# Patient Record
Sex: Female | Born: 1965 | Race: White | Hispanic: No | Marital: Married | Smoking: Never smoker
Health system: Southern US, Community
[De-identification: ages and names within clinical notes are randomized; demographics above are authoritative.]

## PROBLEM LIST (undated history)

## (undated) DIAGNOSIS — F329 Major depressive disorder, single episode, unspecified: Secondary | ICD-10-CM

## (undated) DIAGNOSIS — F419 Anxiety disorder, unspecified: Secondary | ICD-10-CM

## (undated) DIAGNOSIS — F32A Depression, unspecified: Secondary | ICD-10-CM

## (undated) DIAGNOSIS — F4310 Post-traumatic stress disorder, unspecified: Secondary | ICD-10-CM

## (undated) HISTORY — PX: TONSILLECTOMY: SUR1361

## (undated) HISTORY — PX: FOOT SURGERY: SHX648

## (undated) HISTORY — PX: LAPAROSCOPIC ENDOMETRIOSIS FULGURATION: SUR769

## (undated) HISTORY — PX: DILATION AND CURETTAGE OF UTERUS: SHX78

## (undated) HISTORY — PX: BUNIONECTOMY: SHX129

## (undated) HISTORY — PX: TMJ ARTHROSCOPY: SHX1067

---

## 2006-09-05 ENCOUNTER — Ambulatory Visit (HOSPITAL_COMMUNITY): Admission: RE | Admit: 2006-09-05 | Discharge: 2006-09-05 | Payer: Self-pay | Admitting: Pulmonary Disease

## 2006-09-05 ENCOUNTER — Ambulatory Visit: Payer: Self-pay | Admitting: Pulmonary Disease

## 2006-12-10 ENCOUNTER — Ambulatory Visit: Payer: Self-pay | Admitting: Pulmonary Disease

## 2007-07-14 ENCOUNTER — Emergency Department (HOSPITAL_COMMUNITY): Admission: EM | Admit: 2007-07-14 | Discharge: 2007-07-14 | Payer: Self-pay | Admitting: Emergency Medicine

## 2007-09-17 ENCOUNTER — Ambulatory Visit (HOSPITAL_COMMUNITY): Admission: RE | Admit: 2007-09-17 | Discharge: 2007-09-17 | Payer: Self-pay | Admitting: Urology

## 2009-05-27 ENCOUNTER — Encounter: Admission: RE | Admit: 2009-05-27 | Discharge: 2009-05-27 | Payer: Self-pay | Admitting: Family Medicine

## 2010-09-27 ENCOUNTER — Inpatient Hospital Stay (HOSPITAL_COMMUNITY): Admission: AD | Admit: 2010-09-27 | Payer: Self-pay | Source: Home / Self Care | Admitting: Obstetrics and Gynecology

## 2010-10-11 NOTE — Assessment & Plan Note (Signed)
[facility]                             PULMONARY OFFICE NOTE   ASHANTIA, AMARAL                         MRN:          [id number]  DATE:12/10/2006                            DOB:          [birth year]    I saw Ms. Pelcher in followup today for her recurrent pneumonia, asthma  and allergic rhinitis.  She had spirometry done on November 08, 2006, and  this showed her FEV1 to [facility] ratio was 86%, her FEV1 was 2.68L, which was  84% of predicted, her FVC was 3.111, which 79% of predicted, which would  be suggestive of restricted defect which would be mild, although based  on review of her flow volume loop this was unremarkable.  She did not  receive bronchodilators.  She says that her symptoms are reasonably  stable at the present time.  She is not having much problems with nasal  congestion, post nasal drip, sore throat, cough, wheezing or sputum  production.  She is not having any chest pains either.   CURRENT MEDICATIONS:  1. Allerest twice daily.  2. Astelin as needed.  3. Nasonex as needed.  4. Singulair as needed.   PHYSICAL EXAM:  She is 118 pounds, temperature is 98.1, blood pressure  is 108/61, heart rate is 81, oxygen saturation 98% on room air.  HEENT:  There is no sinus tenderness, no nasal discharge.  She has mild  erythema of the posterior pharynx.  There is no lymphadenopathy.  HEART:  Regular S1, S2.  CHEST:  Clear to auscultation.  ABDOMEN:  Soft, nontender.  EXTREMITIES:  No edema.   IMPRESSION:  Recurrent episodes of pneumonia in the setting of asthma  and allergic rhinitis.  My suspicion is that she probably has flares of  her allergies during the Fall season which may likely trigger a viral  respiratory tract infection which would then predispose her to  pneumonia.  What I have suggested is that she receive early and  aggressive therapy for any signs of upper respiratory infections or  flares of her allergies, in the hopes that this  would prevent her from  getting any recurrence of pneumonia.  I do not feel that she would need  to have any additional testing at this time.  She is to follow up with  her primary care physician, but certainly can be referred back for  further pulmonary evaluation if her symptom status were to worsen.     Coralyn Helling, MD  Electronically Signed    VS/MedQ  DD: 12/10/2006  DT: 12/11/2006  Job #: [id number]   cc:   Tammy R. Collins [place], M.D.

## 2010-10-14 NOTE — Assessment & Plan Note (Signed)
[facility]                             PULMONARY OFFICE NOTE   PHOENIX, RIESEN                         MRN:          [id number]  DATE:09/05/2006                            DOB:          Apr 07, 1966    I met Mrs. Bells today for evaluation of her recurrent pneumonias.   She said that over the last 2 years she has had eight episodes of what  she described as walking pneumonia.  She says that she would get  symptoms of a cold and upper respiratory tract infection as well as  sinus congestion and this progressed to a cough and tired feeling.  She  then developed greenish sputum as well as dyspnea.  She has taken  several courses of antibiotics with improvement in her symptoms.  She  was seen by Dr. Natasha Mead when she lived in [place].  She moved to the  [place] area in November 2007 as a result of her husband having his  job changed.  She says that she had a blood test done which was positive  for mycoplasma when she had her initial episode of walking pneumonia.  She last had a chest x-ray in February 2008 when she had her last  episode of pneumonia.  Unfortunately, I do not have this available for  my review at this time.  She says that she feels like she is starting to  develop sinus congestion again but currently denies any symptoms of  fevers, chills, sweats, coughing, sputum production, chest pain or chest  tightness.  She also denies any symptoms of nausea, vomiting or  diarrhea.  She denies any skin rashes or eczema.  She also denies any  joint tenderness.  She says that she was diagnosed with asthma  approximately 10 years ago.  She had been on Singulair, albuterol and  Advair but has not used these for the last 5 years.  She does get  rhinitis symptoms with the change of symptoms as well as when she is  around cats or if she is exposed to strong smells.  She says that she  also was diagnosed with lupus at [facility] several years  ago.  She says at that time she was having symptoms of hair loss, infertility,  and Raynaud's, but apparently she has not had recurrence of these  symptoms and has not been on any treatment for this.   PAST MEDICAL HISTORY:  Significant for:  1. Asthma.  2. Rhinitis.  3. Endometriosis.  4. Depression.  5. Lupus.  6. She has had bunionectomies on her feet.   CURRENT MEDICATIONS:  AlleRx and Paxil.  She also uses Astelin, Nasonex,  and Singulair on an as-needed basis.  She has no known drug allergies.   SOCIAL HISTORY:  She is married.  She has three adopted children.  There  is no history of tobacco or alcohol use.  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   FAMILY HISTORY:  Significant for her father who had an immune disease  involving his heart and her mother  who had lupus.   REVIEW OF SYSTEMS:  She has a history of rosacea.  She says that she  also had wheat intolerance but never actually had this evaluated for the  possibility of celiac sprue.   PHYSICAL EXAMINATION:  VITAL SIGNS:  She is 5 feet 6 inches tall, 116  pounds.  Temperature is 98, blood pressure is 112/80, heart rate is 80,  oxygen saturation is 100% on room air.  HEENT:  Pupils reactive.  There is no sinus tenderness.  She has dry  nasal mucosa which is pale.  There are no oral lesions, no  lymphadenopathy, no thyromegaly.  HEART:  S1, S2, regular rate and rhythm.  CHEST:  Clear to auscultation.  ABDOMEN:  Thin, soft and tender.  Positive bowel sounds.  EXTREMITIES:  There was no edema, cyanosis, clubbing or rashes.  NEUROLOGIC:  No focal deficits were appreciated.   IMPRESSION:  Recurrent episodes of pneumonia.  I suspect that she has a  history of asthma with allergic rhinitis and that she likely has an  exacerbation of this which then predisposes her to getting recurrent  episodes of pneumonia.  I will continue her on her current regimen of  Astelin and Nasonex for her sinus  symptoms on an as-needed basis.  It  appears that her symptoms are somewhat intermittent otherwise and I do  not think that she would need to be on maintenance therapy for these  conditions at this time.  It is possible that she could be having some  degree of lupus involvement which could be contributing to her recent  episodes of pneumonia, although this seems less likely as she is not  having any other symptoms to go along with this.  I will have her  undergo a repeat chest x-ray as well as pulmonary function tests and  then after review of this, further recommendations will be made.   She does also complain of wheat intolerance.  I suggested that it may be  beneficial to have her undergo evaluation by gastroenterologist for the  possibility of celiac sprue, particularly in light of the fact that she  does have a history of lupus and there may be an association with the  autoimmune diseases.   I will follow up with her in approximately 4 weeks.     Coralyn Helling, MD  Electronically Signed    VS/MedQ  DD: 09/05/2006  DT: 09/05/2006  Job #: [id number]   cc:   Tammy R. Collins [place], M.D.

## 2011-02-17 LAB — DIFFERENTIAL
Basophils Relative: 1
Eosinophils Absolute: 0
Monocytes Absolute: 0.2
Monocytes Relative: 2 — ABNORMAL LOW

## 2011-02-17 LAB — COMPREHENSIVE METABOLIC PANEL
ALT: 13
AST: 19
Albumin: 3.8
Alkaline Phosphatase: 43
Chloride: 101
Potassium: 3.8
Total Bilirubin: 0.8

## 2011-02-17 LAB — WET PREP, GENITAL: Clue Cells Wet Prep HPF POC: NONE SEEN

## 2011-02-17 LAB — RAPID URINE DRUG SCREEN, HOSP PERFORMED
Benzodiazepines: NOT DETECTED
Cocaine: NOT DETECTED
Tetrahydrocannabinol: NOT DETECTED

## 2011-02-17 LAB — CBC
Hemoglobin: 12.1
Platelets: 268
RDW: 12.3

## 2011-02-17 LAB — URINALYSIS, ROUTINE W REFLEX MICROSCOPIC
Glucose, UA: NEGATIVE
Ketones, ur: NEGATIVE
Protein, ur: NEGATIVE
Urobilinogen, UA: 0.2

## 2011-02-17 LAB — GC/CHLAMYDIA PROBE AMP, GENITAL

## 2011-04-05 ENCOUNTER — Other Ambulatory Visit: Payer: Self-pay | Admitting: Family Medicine

## 2011-04-05 DIAGNOSIS — M545 Low back pain, unspecified: Secondary | ICD-10-CM

## 2011-04-07 ENCOUNTER — Ambulatory Visit
Admission: RE | Admit: 2011-04-07 | Discharge: 2011-04-07 | Disposition: A | Discharge status: Home / Self Care | Payer: 59 | Source: Ambulatory Visit | Attending: Family Medicine | Admitting: Family Medicine

## 2011-04-07 DIAGNOSIS — M545 Low back pain, unspecified: Secondary | ICD-10-CM

## 2013-01-10 ENCOUNTER — Emergency Department (HOSPITAL_COMMUNITY)
Admission: EM | Admit: 2013-01-10 | Discharge: 2013-01-10 | Disposition: A | Discharge status: Home / Self Care | Payer: 59 | Attending: Emergency Medicine | Admitting: Emergency Medicine

## 2013-01-10 ENCOUNTER — Encounter (HOSPITAL_COMMUNITY): Payer: Self-pay | Admitting: Emergency Medicine

## 2013-01-10 DIAGNOSIS — Z3202 Encounter for pregnancy test, result negative: Secondary | ICD-10-CM | POA: Insufficient documentation

## 2013-01-10 DIAGNOSIS — F411 Generalized anxiety disorder: Secondary | ICD-10-CM | POA: Insufficient documentation

## 2013-01-10 DIAGNOSIS — Z79899 Other long term (current) drug therapy: Secondary | ICD-10-CM | POA: Insufficient documentation

## 2013-01-10 DIAGNOSIS — F4310 Post-traumatic stress disorder, unspecified: Secondary | ICD-10-CM | POA: Insufficient documentation

## 2013-01-10 DIAGNOSIS — F3289 Other specified depressive episodes: Secondary | ICD-10-CM | POA: Insufficient documentation

## 2013-01-10 DIAGNOSIS — R45851 Suicidal ideations: Secondary | ICD-10-CM | POA: Insufficient documentation

## 2013-01-10 DIAGNOSIS — F329 Major depressive disorder, single episode, unspecified: Secondary | ICD-10-CM | POA: Insufficient documentation

## 2013-01-10 HISTORY — DX: Anxiety disorder, unspecified: F41.9

## 2013-01-10 HISTORY — DX: Major depressive disorder, single episode, unspecified: F32.9

## 2013-01-10 HISTORY — DX: Post-traumatic stress disorder, unspecified: F43.10

## 2013-01-10 HISTORY — DX: Depression, unspecified: F32.A

## 2013-01-10 LAB — COMPREHENSIVE METABOLIC PANEL
ALT: 9 U/L (ref 0–35)
AST: 17 U/L (ref 0–37)
Albumin: 3.9 g/dL (ref 3.5–5.2)
Alkaline Phosphatase: 70 U/L (ref 39–117)
BUN: 10 mg/dL (ref 6–23)
Chloride: 105 mEq/L (ref 96–112)
Potassium: 3.5 mEq/L (ref 3.5–5.1)
Sodium: 140 mEq/L (ref 135–145)
Total Bilirubin: 0.7 mg/dL (ref 0.3–1.2)

## 2013-01-10 LAB — POCT PREGNANCY, URINE: Preg Test, Ur: NEGATIVE

## 2013-01-10 LAB — CBC WITH DIFFERENTIAL/PLATELET
Basophils Relative: 1 % (ref 0–1)
Hemoglobin: 12.2 g/dL (ref 12.0–15.0)
MCHC: 31.7 g/dL (ref 30.0–36.0)
Monocytes Relative: 9 % (ref 3–12)
Neutro Abs: 3.6 10*3/uL (ref 1.7–7.7)
Neutrophils Relative %: 61 % (ref 43–77)
RBC: 4.62 MIL/uL (ref 3.87–5.11)

## 2013-01-10 LAB — RAPID URINE DRUG SCREEN, HOSP PERFORMED
Barbiturates: NOT DETECTED
Opiates: NOT DETECTED
Tetrahydrocannabinol: NOT DETECTED

## 2013-01-10 LAB — ETHANOL: Alcohol, Ethyl (B): <11 mg/dL (ref 0–11)

## 2013-01-10 MED ORDER — IBUPROFEN 200 MG PO TABS: 600.0000 mg | ORAL_TABLET | Freq: Three times a day (TID) | ORAL | Status: DC | PRN

## 2013-01-10 MED ORDER — ALPRAZOLAM 0.25 MG PO TABS: 0.2500 mg | ORAL_TABLET | Freq: Every day | ORAL | Status: DC | PRN

## 2013-01-10 MED ORDER — ONDANSETRON HCL 4 MG PO TABS: 4.0000 mg | ORAL_TABLET | Freq: Three times a day (TID) | ORAL | Status: DC | PRN

## 2013-01-10 MED ORDER — NICOTINE 21 MG/24HR TD PT24: 21.0000 mg | MEDICATED_PATCH | Freq: Every day | TRANSDERMAL | Status: DC | PRN

## 2013-01-10 MED ORDER — MIRTAZAPINE 30 MG PO TABS: 30.0000 mg | ORAL_TABLET | Freq: Every day | ORAL | Status: DC

## 2013-01-10 MED ORDER — PROBIOTIC DAILY PO CAPS: 1.0000 | ORAL_CAPSULE | Freq: Every day | ORAL | Status: DC

## 2013-01-10 MED ORDER — PANTOPRAZOLE SODIUM 40 MG PO TBEC
40.0000 mg | DELAYED_RELEASE_TABLET | Freq: Every day | ORAL | Status: DC
  Filled 2013-01-10: qty 1

## 2013-01-10 MED ORDER — DULOXETINE HCL 60 MG PO CPEP: 60.0000 mg | ORAL_CAPSULE | Freq: Every day | ORAL | Status: DC

## 2013-01-10 MED ORDER — ZOLPIDEM TARTRATE 5 MG PO TABS: 5.0000 mg | ORAL_TABLET | Freq: Every evening | ORAL | Status: DC | PRN

## 2013-01-10 MED ORDER — RISAQUAD PO CAPS
1.0000 | ORAL_CAPSULE | Freq: Every day | ORAL | Status: DC
  Filled 2013-01-10: qty 1

## 2013-01-10 MED ORDER — ACETAMINOPHEN 325 MG PO TABS: 650.0000 mg | ORAL_TABLET | ORAL | Status: DC | PRN

## 2013-01-10 MED ORDER — LORAZEPAM 1 MG PO TABS: 1.0000 mg | ORAL_TABLET | Freq: Three times a day (TID) | ORAL | Status: DC | PRN

## 2013-01-10 NOTE — ED Provider Notes (Signed)
CSN: [id number]     Arrival date & time 01/10/13  1139 History     First MD Initiated Contact with Patient 01/10/13 1146     Chief Complaint  Patient presents with  . Medical Clearance  . Suicidal   (Consider location/radiation/quality/duration/timing/severity/associated sxs/prior Treatment) The history is provided by the patient and medical records.   Patient presents to the ED for SI with plan. Patient states she was seen at her counselor's office this morning and was referred to the ED for further evaluation. Patient states on Wednesday 01/08/13, she planned suicide by drug and EtOH overdose but her counselor arrived at her home before she could follow through with her plan. Patient states she continues to have suicidal thoughts, but they are not as intense as they were earlier in the week.  Patient does not have a specific plan at this time. Denies any illicit drug or alcohol abuse. Patient has a history of PTSD, anxiety, and depression. States she has been taking all her medications as directed. Denies HI, or AVH. Denies any chest pain, shortness of breath, abdominal pain, nausea, vomiting, or diarrhea. No recent fevers, sweats, or chills.  Past Medical History  Diagnosis Date  . PTSD (post-traumatic stress disorder)   . Anxiety   . Depression    Past Surgical History  Procedure Laterality Date  . Bunionectomy    . Laparoscopic endometriosis fulguration    . Tonsillectomy     No family history on file. History  Substance Use Topics  . Smoking status: Never Smoker   . Smokeless tobacco: Not on file  . Alcohol Use: No   OB History   Grav Para Term Preterm Abortions TAB SAB Ect Mult Living                 Review of Systems  Psychiatric/Behavioral: Positive for suicidal ideas.  All other systems reviewed and are negative.    Allergies  Wheat bran  Home Medications   Current Outpatient Rx  Name  Route  Sig  Dispense  Refill  . ALPRAZolam (XANAX) 0.25 MG tablet  Oral   Take 0.25 mg by mouth daily as needed for anxiety.         Marland Kitchen dexlansoprazole (DEXILANT) 60 MG capsule   Oral   Take 60 mg by mouth daily.         . DULoxetine (CYMBALTA) 60 MG capsule   Oral   Take 60 mg by mouth daily.         . mirtazapine (REMERON) 30 MG tablet   Oral   Take 30 mg by mouth at bedtime.         . Probiotic Product (PROBIOTIC DAILY PO)   Oral   Take 1 capsule by mouth daily.          BP 129/66  Pulse 88  Temp(Src) 98.4 F (36.9 C) (Oral)  Resp 20  SpO2 100%  LMP 12/27/2012  Physical Exam  Nursing note and vitals reviewed. Constitutional: She is oriented to person, place, and time. She appears well-developed and well-nourished. No distress.  HENT:  Head: Normocephalic and atraumatic.  Mouth/Throat: Oropharynx is clear and moist.  Eyes: Conjunctivae and EOM are normal. Pupils are equal, round, and reactive to light.  Neck: Normal range of motion. Neck supple.  Cardiovascular: Normal rate, regular rhythm and normal heart sounds.   Pulmonary/Chest: Effort normal and breath sounds normal. No respiratory distress. She has no wheezes.  Abdominal: Soft. Bowel sounds are normal. There  is no tenderness. There is no guarding.  Musculoskeletal: Normal range of motion.  Neurological: She is alert and oriented to person, place, and time.  Skin: Skin is warm and dry. She is not diaphoretic.  Psychiatric: Her speech is normal. She is not actively hallucinating. She exhibits a depressed mood. She expresses suicidal ideation. She expresses no homicidal ideation. She expresses no suicidal plans and no homicidal plans.    ED Course   Procedures (including critical care time)  Labs Reviewed  SALICYLATE LEVEL - Abnormal; Notable for the following:    Salicylate Lvl <2.0 (*)    All other components within normal limits  CBC WITH DIFFERENTIAL  COMPREHENSIVE METABOLIC PANEL  URINE RAPID DRUG SCREEN (HOSP PERFORMED)  ETHANOL  ACETAMINOPHEN LEVEL  POCT  PREGNANCY, URINE   No results found. No diagnosis found.  MDM   Pt sent from counselors office for psych eval for SI.  Labs as above.  Pt medically cleared and moved to psych ED.  Temp holding orders and home meds placed.  [facility] consulted, they will see her.  VS stable.  Garlon Hatchet, PA-C 01/10/13 [phone]

## 2013-01-10 NOTE — Progress Notes (Signed)
Patient ID: Patty Garrison, female   DOB: 20-Nov-1965, 47 y.o.   MRN: [id number] Pt consulted on -no SI/HI contracts for safety-note to follow-may go home with husband-has intensive IOP with her personal counselor M,W,F next week

## 2013-01-10 NOTE — ED Provider Notes (Signed)
Psych team had evaluated patient and indicated that pt psych cleared for discharge to home, no SI.  I went to reassess/recheck pt, and pt has already left ED, allowed to leave ED by psych ED staff based upon psych teams/psychiatrist evaluation and recommendations to them and pt.   Suzi Roots, MD 01/10/13 2221

## 2013-01-10 NOTE — ED Notes (Signed)
Pt is awake and alert, pt report that her therapist requested for her to come get treatment.  denies HI,SI, pt is requesting to be discharged. Pt is pleasant and cooperative. Support and encouragement offered. Will continue to monitor for safety.

## 2013-01-10 NOTE — ED Notes (Signed)
Pt has 2 belonging bags placed into locker 36

## 2013-01-10 NOTE — Progress Notes (Signed)
pcp is Dr Janace Litten Epic updated

## 2013-01-10 NOTE — ED Notes (Signed)
Pt from home, states she just left her counselor that referred her to this facility d/t SI. Pt states that she attempted to take over 100 pills with ETOH Wednesday night, but her counselor arrived before she could do it. Pt states that she feels like she could harm herself today, but cannot give a plan. Pt is A&O and in NAD.

## 2013-01-12 DIAGNOSIS — R45851 Suicidal ideations: Secondary | ICD-10-CM

## 2013-01-12 DIAGNOSIS — F411 Generalized anxiety disorder: Secondary | ICD-10-CM

## 2013-01-12 DIAGNOSIS — F329 Major depressive disorder, single episode, unspecified: Secondary | ICD-10-CM

## 2013-01-12 DIAGNOSIS — F3289 Other specified depressive episodes: Secondary | ICD-10-CM

## 2013-01-12 DIAGNOSIS — F4310 Post-traumatic stress disorder, unspecified: Secondary | ICD-10-CM

## 2013-01-12 NOTE — ED Provider Notes (Signed)
Medical screening examination/treatment/procedure(s) were performed by non-physician practitioner and as supervising physician I was immediately available for consultation/collaboration.    Vida Roller, MD 01/12/13 [phone]

## 2013-01-12 NOTE — Consult Note (Signed)
[facility] Psychiatry Consult   Reason for Consult: ED Referral Referring Physician:  ED providers [name] is an 47 y.o. female.  Assessment: AXIS I:  Depression with suicidal ideation/PTSD/Anxiety AXIS II:  Deferred AXIS III:   Past Medical History  Diagnosis Date  . PTSD (post-traumatic stress disorder)   . Anxiety   . Depression    AXIS IV:  other psychosocial or environmental problems AXIS V:  41-50 serious symptoms  Plan:  No evidence of imminent risk to self or others at present.    Subjective:   Patty Garrison is a 47 y.o. female patient self admitted with perssitent suicidal ideation after thwarted attempt Weds.Marland Kitchen  HPI:  As above-also see ED admission note Pt related she began to deteriorate after she terminated her relationship with a therapit who was using her to provide support to fellow PTSD patients(? Inappropriately). With the loss of her support sytem and the stress of 3 "rambuncious boys" at home she has become increasingly stressed/ depressed and suicidal.She has however found a new therapist who apparently intervened WEDS at pt home and advised her to come here today as noted in ED admission note. She has taken her meds as prescribed during this period she says. She c/o of being here for 10 hours without evaluation ? She reports that the time has allowed her to contemplate  and reflect on her situation.She has lost the feeling she wants to commit suicide.She reports she has worked out a Higher education careers adviser where her husband will stay at home on Monday/the boys will be returning toschool and she has IOP MWF with her new therapist.  HPI Elements:   Context:  as above.  Past Psychiatric History: Past Medical History  Diagnosis Date  . PTSD (post-traumatic stress disorder)   . Anxiety   . Depression     reports that she has never smoked. She does not have any smokeless tobacco history on file. She reports that she does not drink alcohol or use illicit drugs. No family history on file.          Allergies:   Allergies  Allergen Reactions  . Wheat Bran Other (See Comments)    Headache Stomach cramps    Past Psychiatric History: Diagnosis:  Depression with suicidal ideation in the background of PTSD (survivor)  Hospitalizations:  None reported  Outpatient Care:  As reported  Substance Abuse Care:  NA  Self-Mutilation:  NA  Suicidal Attempts:  Weds nite as noted  Violent Behaviors:  NA   Objective: Blood pressure 109/67, pulse 79, temperature 98.9 F (37.2 C), temperature source Oral, resp. rate 20, last menstrual period 12/27/2012, SpO2 98.00%.There is no height or weight on file to calculate BMI. Results for orders placed during the hospital encounter of 01/10/13 (from the past 72 hour(s))  CBC WITH DIFFERENTIAL     Status: None   Collection Time    01/10/13 12:16 PM      Result Value Range   WBC 5.9  4.0 - 10.5 K/uL   RBC 4.62  3.87 - 5.11 MIL/uL   Hemoglobin 12.2  12.0 - 15.0 g/dL   HCT 16.1  09.6 - 04.5 %   MCV 83.3  78.0 - 100.0 fL   MCH 26.4  26.0 - 34.0 pg   MCHC 31.7  30.0 - 36.0 g/dL   RDW 40.9  81.1 - 91.4 %   Platelets 312  150 - 400 K/uL   Neutrophils Relative % 61  43 - 77 %  Neutro Abs 3.6  1.7 - 7.7 K/uL   Lymphocytes Relative 28  12 - 46 %   Lymphs Abs 1.7  0.7 - 4.0 K/uL   Monocytes Relative 9  3 - 12 %   Monocytes Absolute 0.5  0.1 - 1.0 K/uL   Eosinophils Relative 1  0 - 5 %   Eosinophils Absolute 0.1  0.0 - 0.7 K/uL   Basophils Relative 1  0 - 1 %   Basophils Absolute 0.1  0.0 - 0.1 K/uL  COMPREHENSIVE METABOLIC PANEL     Status: None   Collection Time    01/10/13 12:16 PM      Result Value Range   Sodium 140  135 - 145 mEq/L   Potassium 3.5  3.5 - 5.1 mEq/L   Chloride 105  96 - 112 mEq/L   CO2 26  19 - 32 mEq/L   Glucose, Bld 76  70 - 99 mg/dL   BUN 10  6 - 23 mg/dL   Creatinine, Ser 4.09  0.50 - 1.10 mg/dL   Calcium 9.1  8.4 - 81.1 mg/dL   Total Protein 7.3  6.0 - 8.3 g/dL   Albumin 3.9  3.5 - 5.2 g/dL   AST 17  0 - 37  U/L   ALT 9  0 - 35 U/L   Alkaline Phosphatase 70  39 - 117 U/L   Total Bilirubin 0.7  0.3 - 1.2 mg/dL   GFR calc non Af Amer >90  >90 mL/min   GFR calc Af Amer >90  >90 mL/min   Comment: (NOTE)     The eGFR has been calculated using the CKD EPI equation.     This calculation has not been validated in all clinical situations.     eGFR's persistently <90 mL/min signify possible Chronic Kidney     Disease.  ETHANOL     Status: None   Collection Time    01/10/13 12:16 PM      Result Value Range   Alcohol, Ethyl (B) <11  0 - 11 mg/dL   Comment:            LOWEST DETECTABLE LIMIT FOR     SERUM ALCOHOL IS 11 mg/dL     FOR MEDICAL PURPOSES ONLY  ACETAMINOPHEN LEVEL     Status: None   Collection Time    01/10/13 12:16 PM      Result Value Range   Acetaminophen (Tylenol), Serum <15.0  10 - 30 ug/mL   Comment:            THERAPEUTIC CONCENTRATIONS VARY     SIGNIFICANTLY. A RANGE OF 10-30     ug/mL MAY BE AN EFFECTIVE     CONCENTRATION FOR MANY PATIENTS.     HOWEVER, SOME ARE BEST TREATED     AT CONCENTRATIONS OUTSIDE THIS     RANGE.     ACETAMINOPHEN CONCENTRATIONS     >150 ug/mL AT 4 HOURS AFTER     INGESTION AND >50 ug/mL AT 12     HOURS AFTER INGESTION ARE     OFTEN ASSOCIATED WITH TOXIC     REACTIONS.  SALICYLATE LEVEL     Status: Abnormal   Collection Time    01/10/13 12:16 PM      Result Value Range   Salicylate Lvl <2.0 (*) 2.8 - 20.0 mg/dL  URINE RAPID DRUG SCREEN (HOSP PERFORMED)     Status: None   Collection Time    01/10/13 12:29 PM  Result Value Range   Opiates NONE DETECTED  NONE DETECTED   Cocaine NONE DETECTED  NONE DETECTED   Benzodiazepines NONE DETECTED  NONE DETECTED   Amphetamines NONE DETECTED  NONE DETECTED   Tetrahydrocannabinol NONE DETECTED  NONE DETECTED   Barbiturates NONE DETECTED  NONE DETECTED   Comment:            DRUG SCREEN FOR MEDICAL PURPOSES     ONLY.  IF CONFIRMATION IS NEEDED     FOR ANY PURPOSE, NOTIFY LAB     WITHIN 5 DAYS.                 LOWEST DETECTABLE LIMITS     FOR URINE DRUG SCREEN     Drug Class       Cutoff (ng/mL)     Amphetamine      1000     Barbiturate      200     Benzodiazepine   200     Tricyclics       300     Opiates          300     Cocaine          300     THC              50  POCT PREGNANCY, URINE     Status: None   Collection Time    01/10/13 12:34 PM      Result Value Range   Preg Test, Ur NEGATIVE  NEGATIVE   Comment:            THE SENSITIVITY OF THIS     METHODOLOGY IS >24 mIU/mL   Labs are reviewed and are pertinent for normal values.  No current facility-administered medications for this encounter.   Current Outpatient Prescriptions  Medication Sig Dispense Refill  . ALPRAZolam (XANAX) 0.25 MG tablet Take 0.25 mg by mouth daily as needed for anxiety.      Marland Kitchen dexlansoprazole (DEXILANT) 60 MG capsule Take 60 mg by mouth daily.      . DULoxetine (CYMBALTA) 60 MG capsule Take 60 mg by mouth daily.      . mirtazapine (REMERON) 30 MG tablet Take 30 mg by mouth at bedtime.      . Probiotic Product (PROBIOTIC DAILY PO) Take 1 capsule by mouth daily.        Psychiatric Specialty Exam:     Blood pressure 109/67, pulse 79, temperature 98.9 F (37.2 C), temperature source Oral, resp. rate 20, last menstrual period 12/27/2012, SpO2 98.00%.There is no height or weight on file to calculate BMI.  General Appearance: Well Groomed  Patent attorney::  Good  Speech:  Clear and Coherent  Volume:  Normal  Mood:  Euthymic  Affect:  Congruent  Thought Process:  Goal Directed  Orientation:  Full (Time, Place, and Person)  Thought Content:  WDL  Suicidal Thoughts:  No  Homicidal Thoughts:  No  Memory:  Negative  Judgement:  Fair  Insight:  Fair  Psychomotor Activity:  Normal  Concentration:  Good  Recall:  Good  Akathisia:  NA  Handed:  Right  AIMS (if indicated):     Assets:  Social Support  Sleep:   Disturbed   Treatment Plan Summary: Per HPI/patient plan as reported.Husband  is here to pick her up  Court Joy 01/12/2013 11:07 PM

## 2013-01-13 NOTE — Consult Note (Signed)
Agree with plan for husband to closely monitor the pt

## 2016-06-12 DIAGNOSIS — K219 Gastro-esophageal reflux disease without esophagitis: Secondary | ICD-10-CM | POA: Diagnosis not present

## 2016-06-12 DIAGNOSIS — D509 Iron deficiency anemia, unspecified: Secondary | ICD-10-CM | POA: Diagnosis not present

## 2016-11-11 DIAGNOSIS — M79675 Pain in left toe(s): Secondary | ICD-10-CM | POA: Diagnosis not present

## 2017-02-27 DIAGNOSIS — E010 Iodine-deficiency related diffuse (endemic) goiter: Secondary | ICD-10-CM | POA: Diagnosis not present

## 2017-02-27 DIAGNOSIS — R509 Fever, unspecified: Secondary | ICD-10-CM | POA: Diagnosis not present

## 2017-02-27 DIAGNOSIS — E0789 Other specified disorders of thyroid: Secondary | ICD-10-CM | POA: Diagnosis not present

## 2017-02-28 ENCOUNTER — Other Ambulatory Visit: Payer: Self-pay | Admitting: Family Medicine

## 2017-02-28 DIAGNOSIS — E010 Iodine-deficiency related diffuse (endemic) goiter: Secondary | ICD-10-CM

## 2017-03-01 DIAGNOSIS — E060 Acute thyroiditis: Secondary | ICD-10-CM | POA: Diagnosis not present

## 2017-03-02 ENCOUNTER — Ambulatory Visit
Admission: RE | Admit: 2017-03-02 | Discharge: 2017-03-02 | Disposition: A | Discharge status: Home / Self Care | Payer: 59 | Source: Ambulatory Visit | Attending: Family Medicine | Admitting: Family Medicine

## 2017-03-02 DIAGNOSIS — E010 Iodine-deficiency related diffuse (endemic) goiter: Secondary | ICD-10-CM

## 2017-03-06 ENCOUNTER — Other Ambulatory Visit: Payer: Self-pay | Admitting: Family Medicine

## 2017-03-06 DIAGNOSIS — E041 Nontoxic single thyroid nodule: Secondary | ICD-10-CM

## 2017-03-08 ENCOUNTER — Other Ambulatory Visit: Payer: Self-pay | Admitting: Family Medicine

## 2017-03-08 DIAGNOSIS — E041 Nontoxic single thyroid nodule: Secondary | ICD-10-CM

## 2017-03-12 ENCOUNTER — Emergency Department (HOSPITAL_COMMUNITY)
Admission: EM | Admit: 2017-03-12 | Discharge: 2017-03-13 | Disposition: A | Discharge status: Home / Self Care | Payer: 59 | Attending: Emergency Medicine | Admitting: Emergency Medicine

## 2017-03-12 ENCOUNTER — Encounter (HOSPITAL_COMMUNITY): Payer: Self-pay | Admitting: *Deleted

## 2017-03-12 DIAGNOSIS — E060 Acute thyroiditis: Secondary | ICD-10-CM | POA: Diagnosis not present

## 2017-03-12 DIAGNOSIS — D72829 Elevated white blood cell count, unspecified: Secondary | ICD-10-CM | POA: Insufficient documentation

## 2017-03-12 DIAGNOSIS — R509 Fever, unspecified: Secondary | ICD-10-CM | POA: Diagnosis not present

## 2017-03-12 DIAGNOSIS — Z79899 Other long term (current) drug therapy: Secondary | ICD-10-CM | POA: Diagnosis not present

## 2017-03-12 MED ORDER — ACETAMINOPHEN 325 MG PO TABS
650.0000 mg | ORAL_TABLET | Freq: Once | ORAL | Status: AC
  Administered 2017-03-12: 650 mg via ORAL

## 2017-03-12 MED ORDER — DEXTROSE 5 % IV SOLN
1.0000 g | Freq: Once | INTRAVENOUS | Status: AC
  Administered 2017-03-13: 1 g via INTRAVENOUS
  Filled 2017-03-12: qty 10

## 2017-03-12 MED ORDER — ACETAMINOPHEN 325 MG PO TABS
ORAL_TABLET | ORAL | Status: AC
  Filled 2017-03-12: qty 2

## 2017-03-12 NOTE — ED Triage Notes (Signed)
Pt in from PCP office for elevated WBC, sent here for IV antibiotics for thyroiditis, pt denies pain, denies n/v/d, reports ongoing fever over the last month, A&O x4

## 2017-03-12 NOTE — ED Provider Notes (Signed)
[facility] EMERGENCY DEPARTMENT Provider Note   CSN: [id number] Arrival date & time: 03/12/17  1621     History   Chief Complaint Chief Complaint  Patient presents with  . Abnormal Lab    HPI Patty Garrison is a 51 y.o. female.  HPI  This is a 51 year old female with a history of anxiety and depression who presents with fevers. Patient reports that she has been evaluated multiple times in the last month by her primary physician. She reports daily fevers as recently as 102.  Patient reports that she was diagnosed with thyroiditis. She was given steroids which helped for some time but then had recurrence of fevers. She saw her primary physician today who did repeat lab work. During this timeframe she has only had sore throat. She denies any cough, chest pain, shortness of breath, nausea, vomiting, diarrhea.  She reports that she has had a thyroid ultrasound and is due to have her thyroid biopsy on Thursday. She reports that she was given amoxicillin by her primary physician today to treat for possible infection. Once that her white blood count was elevated and her primary physician called her to come to the ER for "IV antibiotics because they will work faster."  Past Medical History:  Diagnosis Date  . Anxiety   . Depression   . PTSD (post-traumatic stress disorder)     There are no active problems to display for this patient.   Past Surgical History:  Procedure Laterality Date  . BUNIONECTOMY    . LAPAROSCOPIC ENDOMETRIOSIS FULGURATION    . TONSILLECTOMY      OB History    No data available       Home Medications    Prior to Admission medications   Medication Sig Start Date End Date Taking? Authorizing Provider  amoxicillin-clavulanate (AUGMENTIN) 875-125 MG tablet Take 1 tablet by mouth 2 (two) times daily.   Yes [provider]  cetirizine (ZYRTEC) 10 MG tablet Take 10 mg by mouth daily.   Yes [provider]  fluticasone  (FLONASE) 50 MCG/ACT nasal spray Place 1 spray into both nostrils at bedtime.   Yes [provider]  mirtazapine (REMERON) 15 MG tablet Take 15 mg by mouth at bedtime.   Yes [provider]  predniSONE (DELTASONE) 20 MG tablet Take 40 mg by mouth daily with breakfast.   Yes [provider]  Probiotic Product (PROBIOTIC DAILY PO) Take 1 capsule by mouth daily.   Yes [provider]  propranolol (INDERAL) 10 MG tablet Take 10 mg by mouth daily.   Yes [provider]    Family History No family history on file.  Social History Social History  Substance Use Topics  . Smoking status: Never Smoker  . Smokeless tobacco: Never Used  . Alcohol use No     Allergies   Wheat bran   Review of Systems Review of Systems  Constitutional: Positive for fever. Negative for fatigue.  HENT: Positive for sore throat. Negative for trouble swallowing.   Respiratory: Negative for cough and shortness of breath.   Cardiovascular: Negative for chest pain.  Gastrointestinal: Negative for abdominal pain, nausea and vomiting.  Genitourinary: Negative for dysuria.  Musculoskeletal: Negative for neck pain and neck stiffness.  All other systems reviewed and are negative.    Physical Exam Updated Vital Signs BP 113/70   Pulse (!) 58   Temp 97.9 F (36.6 C) (Oral)   Resp 18   LMP 03/12/2017 (Approximate)  SpO2 100%   Physical Exam  Constitutional: She is oriented to person, place, and time. She appears well-developed and well-nourished. No distress.  HENT:  Head: Normocephalic and atraumatic.  Mouth/Throat: Oropharynx is clear and moist. No oropharyngeal exudate.  Eyes: Pupils are equal, round, and reactive to light. EOM are normal.  Neck: Normal range of motion. Neck supple. No tracheal deviation present. Thyromegaly present.  Cardiovascular: Normal rate, regular rhythm and normal heart sounds.   Pulmonary/Chest: Effort normal and breath sounds normal.  No respiratory distress. She has no wheezes.  Abdominal: Soft. Bowel sounds are normal. There is no tenderness.  Lymphadenopathy:    She has no cervical adenopathy.  Neurological: She is alert and oriented to person, place, and time.  Skin: Skin is warm and dry.  Psychiatric: She has a normal mood and affect.  Nursing note and vitals reviewed.    ED Treatments / Results  Labs (all labs ordered are listed, but only abnormal results are displayed) Labs Reviewed  CULTURE, BLOOD (ROUTINE X 2)  CULTURE, BLOOD (ROUTINE X 2)    EKG  EKG Interpretation None       Radiology No results found.  Procedures Procedures (including critical care time)  Medications Ordered in ED Medications  acetaminophen (TYLENOL) tablet 650 mg (650 mg Oral Given 03/12/17 2153)  cefTRIAXone (ROCEPHIN) 1 g in dextrose 5 % 50 mL IVPB (0 g Intravenous Stopped 03/13/17 0045)     Initial Impression / Assessment and Plan / ED Course  I have reviewed the triage vital signs and the nursing notes.  Pertinent labs & imaging results that were available during my care of the patient were reviewed by me and considered in my medical decision making (see chart for details).     Patient presents with fevers 1 month. She reports being treated for thyroiditis by her primary physician. She was seen today and had blood drawn. Reports leukocyte ptosis. I have reviewed her labs. She had a white blood cell count of 20 with a left shift. She was given antibiotics by her primary physician but told to come here for IV antibiotics. Unfortunately, I cannot speak to her primary physician directly and I am somewhat unclear as to what she is being treated for. She is currently nontoxic and has no complaints. Blood cultures obtained and patient given IV Rocephin. Will refer back to her primary physician.  After history, exam, and medical workup I feel the patient has been appropriately medically screened and is safe for discharge  home. Pertinent diagnoses were discussed with the patient. Patient was given return precautions.   Final Clinical Impressions(s) / ED Diagnoses   Final diagnoses:  Fever, unspecified fever cause  Leukocytosis, unspecified type    New Prescriptions New Prescriptions   No medications on file     Shon Baton, MD 03/13/17 [phone]

## 2017-03-12 NOTE — ED Notes (Signed)
Pick[place], MD notified, labs to be obtained once pt is roomed with IV start

## 2017-03-13 NOTE — Discharge Instructions (Signed)
You were seen today for concerns for recurrent fever and thyroiditis. Blood cultures were obtained. You were given IV Rocephin. Follow-up with her primary physician for recheck. If you develop pain or worsening symptoms you should be reevaluated.

## 2017-03-15 ENCOUNTER — Ambulatory Visit
Admission: RE | Admit: 2017-03-15 | Discharge: 2017-03-15 | Disposition: A | Discharge status: Home / Self Care | Payer: 59 | Source: Ambulatory Visit | Attending: Family Medicine | Admitting: Family Medicine

## 2017-03-15 ENCOUNTER — Other Ambulatory Visit (HOSPITAL_COMMUNITY)
Admission: RE | Admit: 2017-03-15 | Discharge: 2017-03-15 | Disposition: A | Discharge status: Home / Self Care | Payer: 59 | Source: Ambulatory Visit | Attending: General Surgery | Admitting: General Surgery

## 2017-03-15 DIAGNOSIS — E042 Nontoxic multinodular goiter: Secondary | ICD-10-CM | POA: Diagnosis not present

## 2017-03-15 DIAGNOSIS — E041 Nontoxic single thyroid nodule: Secondary | ICD-10-CM | POA: Diagnosis not present

## 2017-03-16 DIAGNOSIS — E060 Acute thyroiditis: Secondary | ICD-10-CM | POA: Diagnosis not present

## 2017-03-18 LAB — CULTURE, BLOOD (ROUTINE X 2)
CULTURE: NO GROWTH
Culture: NO GROWTH

## 2017-03-21 DIAGNOSIS — E042 Nontoxic multinodular goiter: Secondary | ICD-10-CM | POA: Diagnosis not present

## 2017-03-21 DIAGNOSIS — E0590 Thyrotoxicosis, unspecified without thyrotoxic crisis or storm: Secondary | ICD-10-CM | POA: Diagnosis not present

## 2017-03-26 DIAGNOSIS — M25512 Pain in left shoulder: Secondary | ICD-10-CM | POA: Diagnosis not present

## 2017-03-28 DIAGNOSIS — M25512 Pain in left shoulder: Secondary | ICD-10-CM | POA: Diagnosis not present

## 2017-04-02 DIAGNOSIS — M25512 Pain in left shoulder: Secondary | ICD-10-CM | POA: Diagnosis not present

## 2017-04-04 DIAGNOSIS — M25512 Pain in left shoulder: Secondary | ICD-10-CM | POA: Diagnosis not present

## 2017-04-09 DIAGNOSIS — M25512 Pain in left shoulder: Secondary | ICD-10-CM | POA: Diagnosis not present

## 2017-04-18 DIAGNOSIS — E0590 Thyrotoxicosis, unspecified without thyrotoxic crisis or storm: Secondary | ICD-10-CM | POA: Diagnosis not present

## 2017-05-04 DIAGNOSIS — Z23 Encounter for immunization: Secondary | ICD-10-CM | POA: Diagnosis not present

## 2017-05-23 DIAGNOSIS — E042 Nontoxic multinodular goiter: Secondary | ICD-10-CM | POA: Diagnosis not present

## 2017-05-23 DIAGNOSIS — Z8639 Personal history of other endocrine, nutritional and metabolic disease: Secondary | ICD-10-CM | POA: Diagnosis not present

## 2017-07-05 DIAGNOSIS — M79645 Pain in left finger(s): Secondary | ICD-10-CM | POA: Diagnosis not present

## 2017-08-03 DIAGNOSIS — D508 Other iron deficiency anemias: Secondary | ICD-10-CM | POA: Diagnosis not present

## 2017-08-03 DIAGNOSIS — E559 Vitamin D deficiency, unspecified: Secondary | ICD-10-CM | POA: Diagnosis not present

## 2017-08-08 DIAGNOSIS — H40013 Open angle with borderline findings, low risk, bilateral: Secondary | ICD-10-CM | POA: Diagnosis not present

## 2017-09-12 DIAGNOSIS — S63635A Sprain of interphalangeal joint of left ring finger, initial encounter: Secondary | ICD-10-CM | POA: Diagnosis not present

## 2017-09-24 DIAGNOSIS — R05 Cough: Secondary | ICD-10-CM | POA: Diagnosis not present

## 2017-09-24 DIAGNOSIS — R21 Rash and other nonspecific skin eruption: Secondary | ICD-10-CM | POA: Diagnosis not present

## 2017-10-10 DIAGNOSIS — S63635A Sprain of interphalangeal joint of left ring finger, initial encounter: Secondary | ICD-10-CM | POA: Diagnosis not present

## 2017-10-11 DIAGNOSIS — L738 Other specified follicular disorders: Secondary | ICD-10-CM | POA: Diagnosis not present

## 2017-10-11 DIAGNOSIS — L719 Rosacea, unspecified: Secondary | ICD-10-CM | POA: Diagnosis not present

## 2017-10-11 DIAGNOSIS — L821 Other seborrheic keratosis: Secondary | ICD-10-CM | POA: Diagnosis not present

## 2017-11-19 DIAGNOSIS — D508 Other iron deficiency anemias: Secondary | ICD-10-CM | POA: Diagnosis not present

## 2017-11-19 DIAGNOSIS — L719 Rosacea, unspecified: Secondary | ICD-10-CM | POA: Diagnosis not present

## 2017-11-28 DIAGNOSIS — E042 Nontoxic multinodular goiter: Secondary | ICD-10-CM | POA: Diagnosis not present

## 2017-11-28 DIAGNOSIS — Z8639 Personal history of other endocrine, nutritional and metabolic disease: Secondary | ICD-10-CM | POA: Diagnosis not present

## 2017-12-18 ENCOUNTER — Ambulatory Visit (INDEPENDENT_AMBULATORY_CARE_PROVIDER_SITE_OTHER): Payer: 59 | Admitting: Family Medicine

## 2017-12-18 ENCOUNTER — Encounter: Payer: Self-pay | Admitting: Family Medicine

## 2017-12-18 DIAGNOSIS — F509 Eating disorder, unspecified: Secondary | ICD-10-CM
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
## 2017-12-18 NOTE — Patient Instructions (Addendum)
-   Email your doses of Ca and vitamin D as well as name and Thereasa Parkinauthor of the book on low-acid diet.    [email or web address]  - Also please email Patty KirkSteve Shores' email address.  I will email him the Polyvagal Primer by therapist Oleh Genineb Dana.  Please read over that document, and let me know if you have Qs.  Talk to Brett CanalesSteve about possibility of using this clinically.    Self-care Guidelines: 1. Eat at least 3 REAL meals and 1-2 snacks per day.  Aim for no more than 5 hours between eating.  Eat breakfast within one hour of getting up.  A REAL meal includes at least some protein, some starch, and vegetables and/or fruit.   OR:  Would you serve this to a guest in your home, and call it a meal? AND: A meal should look like, taste like, and feel like a real meal.    2. Get some kind of physical activity at least 30 min 5 X wk AND intermittent activity through the day.    Ideally, there is also some kind of strength building activity a couple times a week.    Specific Goals: 1. Consume at least 1 "real meal" per day.    - Plan in advance to help make this happen.  Keep the right ingredients on hand.  Ask Bill to prepare meats as needed.  Allow enough TIME for meal prep and eating.    2. Obtain at least 30 minutes of walking (or some other exercise) per week in addition to weekly yoga.    Explore online yoga video options.  Also explore [facility] options as well as Psychologist, forensicCooperative Extension or whatever other resources you can find online.    - Success in meeting these goals requires a very deliberate and focused approach.  It also means figuring out why you are successful on some days as well as what gets in the way on other days.

## 2018-02-04 ENCOUNTER — Encounter: Payer: Self-pay | Admitting: Family Medicine

## 2018-02-04 ENCOUNTER — Ambulatory Visit (INDEPENDENT_AMBULATORY_CARE_PROVIDER_SITE_OTHER): Payer: 59 | Admitting: Family Medicine

## 2018-02-04 DIAGNOSIS — F509 Eating disorder, unspecified: Secondary | ICD-10-CM | POA: Diagnosis not present

## 2018-02-04 NOTE — Patient Instructions (Addendum)
-   Anchor in your memory those times you successfully challenge your anxiety, do it anyway.  - What improves our memories is retrieving that memory repeatedly.    - How long does it take to break a habit?  How long to establish a new habit?  - Give yourself some grace to accomplish your goals!  - We all make mistakes or fail at times; this is part of the human condition!  - Feelings like anxious, fearful, rejected, worthless, inadequate, insecure all impair judgment.   - Using your autonomic ladder worksheet, track where you are during the day; write the time and "glimmer" or "trigger" that put you there.    - When you are struggling with a food (or other) decision, ask yourself these three "decoding" questions (from Dietitian/theologist Gerrianne Scale):    1. What am I feeling right now?    2. What do I want to feel?    3. What do I truly need right now? Use the Feelings and Needs lists, and WRITE your answers.   You are looking for feelings, not thoughts.  Bring your responses to your follow-up appt.   Look at your needs list.  How can you best respond to this?  - Go to Self-compassion.org, and take the quiz on self-compassion.  Email your 7 scores before your weight check appt.  Peruse the website for articles and videos.     - Specific Behavioral Goals remain the same: 1. Daily: Obtain a real breakfast that includes at least bread or cereal and a protein food. 2. Daily: Obtain at least 1 meal that includes a protein source and vegetables.    3. Get at least 30 min of physical activity 2 times per week.    Follow-up WT CHECK ONLY on Oct 7 at 11:30 AM.  Please let the front desk know you are here, and they will call me.

## 2018-02-04 NOTE — Progress Notes (Signed)
Medical Nutrition Therapy:  Appt start time: 1000 end time:  1100. 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  Patty G[place] had a harder time eating the further out she was from her appt in July.  She feels discouraged and frustrated that she was unable to make herself eat real meals.  When asked, she said she's been eating in the current way for 5-10 years, and as suggested to her, she recognizes this ingrained behavior will not change easily or quickly.    She continues to see therapist Fawn Kirk, and they are working on [employer] to recognize environmental triggers and how to reframe as needed.  Suggested exercises from today's appt complement this work.    Learning Readiness: Ready  Usual eating pattern includes 1-2 real meals per week; most days she gets only snacks, usually 4 times per day.   Usual physical activity includes none yet.  Once a week yoga at the [facility] has not started yet.  A barrier to walking for [name] is that she doesn't want to walk alone, which causes her anxiety.   Sleep: Estimates getting ~7 hrs of sleep per night.    24-hr recall:  (Up at 7:30 AM) B (8:30 AM)-  1 blueberry muffin Snk ( AM)-  water L (11:30 PM)-  2 corn muffin/hotdogs (~3/4 hotdog total), 2 1/2 c watermelon, water Snk (3 PM)-  2 1/2 c watermelon, dried mango D ( PM)-  3-4 oz chx, 1/4 c pintos, 1/4 c squash, 1/2 c roasted potatoes, water, 1 bite apple crisp Snk (10 PM)-  1-2 c corn chips Typical day? No. This was biggest since last appt.    Progress Towards Goal(s):  In progress.   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  As evidenced by consistently inadequate food intake .    Intervention:  Nutrition counseling  Handouts given during visit include:  After-Visit Summary (AVS)  Goals Sheet  (revised)  Autonomic ladder worksheet  Feelings and Needs lists  Self-compassion handout  Demonstrated degree of understanding via:  Teach Back  Barriers to learning/adherence to lifestyle change: History of trauma and restrictive eating as coping mechanism to stress  Monitoring/Evaluation:  Dietary intake, exercise, and body weight in 4 week(s) for weight check only.

## 2018-02-04 NOTE — Addendum Note (Signed)
Addended by: Linna Darner on: 02/04/2018 05:03 PM   Modules accepted: Orders

## 2018-03-04 ENCOUNTER — Encounter: Payer: Self-pay | Admitting: Family Medicine

## 2018-03-04 NOTE — Progress Notes (Signed)
Maliya has been walking 1-2 hrs with her husband 4 X wk.  She also has been eating a bit more; getting a breakfast that includes at least a starch, protein (hiigh-protein granola w/ soy milk, and a fruit.  Weight remains stable, however.    Aisling has used the Decoding Qs, which she has found to be very helpful.  She has used them following determining where she is on the autonomic ladder, and has been able to tangibly move up the ladder through the Decoding Qs.    We did a brief Grasp & Pull exercise today, which caused Jeniya some anxiety.  We talked about working on identifying what she needs to "grasp and pull" in her life, such as food.    24-hr recall:  (Up at 7 AM) B (8 AM)-  2 slc [place] bacon, water Snk ( AM)-  --- L (12:30 PM)-  1 homemade corn dog, 1 whole mango, water Snk (3 PM)-  2 c tortilla chips, water D (6:30 PM)-  1 homemade corn dog, 1 banana, 1/3 c blueberries, 1/2 c steamed spinach, water Snk ( PM)-  --- Typical day? No. Doesn't usually have corn dogs, and has been having granola, soy milk, and fruit for bkfst.   Drinks 32-48 oz water/day.

## 2018-03-04 NOTE — Patient Instructions (Signed)
-   Keep at least one type of GF bread in the freezer at home (Eng muffins, bagels, bread, wraps, etc.).    - This would allow you to have a quick sandwich option most of the time.    - Vegetables that are portable for those on-the-run meals:  - Carrots, cucumbers, cherry tomatoes, leftover baked potato/sweet potato, broccoli, cauliflower, zucchini, yellow squash.... [add to this list]. - Protein and starch foods that are portable: Sandwiches (almond butter, ham, chicken, home-cooked [place], tuna, bean burrito).  (Check the protein content of the cheese substitute; regular cheese provides ~7 g pro/oz.   Goals remain the same: 1. Daily: Obtain a real breakfast that includes at least bread or cereal and a protein food. 2. Daily: Obtain at least 1 meal that includes a protein source and vegetables.    3. Get at least 30 min of physical activity 2 times per week.     - Continue to use the Decoding Qs and autonomic ladder as needed, writing answers as you can.  - Pay attention to what you need to take in, and what sometimes gets in the way of that.    - Talk to Brett Canales about this.    - Follow-up: January 14 at 11:30.  Please call or email [email or web address] at least once a month to let me know how things are going.

## 2018-06-11 ENCOUNTER — Ambulatory Visit: Payer: 59 | Admitting: Family Medicine

## 2018-06-27 ENCOUNTER — Ambulatory Visit: Payer: 59 | Admitting: Family Medicine

## 2018-08-29 DIAGNOSIS — S93401A Sprain of unspecified ligament of right ankle, initial encounter: Secondary | ICD-10-CM | POA: Diagnosis not present

## 2018-08-29 DIAGNOSIS — M542 Cervicalgia: Secondary | ICD-10-CM | POA: Diagnosis not present

## 2018-09-11 DIAGNOSIS — S93401A Sprain of unspecified ligament of right ankle, initial encounter: Secondary | ICD-10-CM | POA: Diagnosis not present

## 2018-09-11 DIAGNOSIS — M25571 Pain in right ankle and joints of right foot: Secondary | ICD-10-CM | POA: Diagnosis not present

## 2019-05-08 ENCOUNTER — Other Ambulatory Visit: Payer: Self-pay | Admitting: Family Medicine

## 2019-05-08 DIAGNOSIS — Z1231 Encounter for screening mammogram for malignant neoplasm of breast: Secondary | ICD-10-CM

## 2019-08-02 ENCOUNTER — Ambulatory Visit: Payer: 59 | Attending: Internal Medicine

## 2019-08-02 DIAGNOSIS — Z23 Encounter for immunization: Secondary | ICD-10-CM | POA: Insufficient documentation

## 2019-08-02 NOTE — Progress Notes (Signed)
   Covid-19 Vaccination Clinic  Name:  [place]ty [place]    MRN: [id number] DOB: 10-17-1965  08/02/2019  [place] was observed post Covid-19 immunization for 15 minutes without incident. She was provided with Vaccine Information Sheet and instruction to access the V-Safe system.   [place] was instructed to call 911 with any severe reactions post vaccine: Marland Kitchen Difficulty breathing  . Swelling of face and throat  . A fast heartbeat  . A bad rash all over body  . Dizziness and weakness   Immunizations Administered    Name Date Dose VIS Date Route   Pfizer COVID-19 Vaccine 08/02/2019  4:09 PM 0.3 mL 05/09/2019 Intramuscular   Manufacturer: [employer], [employer]   Lot: [id number]   NDC: [address]-1

## 2019-08-23 ENCOUNTER — Ambulatory Visit: Payer: 59 | Attending: Internal Medicine

## 2019-08-23 DIAGNOSIS — Z23 Encounter for immunization: Secondary | ICD-10-CM

## 2019-08-23 NOTE — Progress Notes (Signed)
   Covid-19 Vaccination Clinic  Name:  Patty G[place]    MRN: [id number] DOB: 1966/05/20  08/23/2019  [name] was observed post Covid-19 immunization for 15 minutes without incident. She was provided with Vaccine Information Sheet and instruction to access the V-Safe system.   [name] was instructed to call 911 with any severe reactions post vaccine: Marland Kitchen Difficulty breathing  . Swelling of face and throat  . A fast heartbeat  . A bad rash all over body  . Dizziness and weakness   Immunizations Administered    Name Date Dose VIS Date Route   Pfizer COVID-19 Vaccine 08/23/2019  2:28 PM 0.3 mL 05/09/2019 Intramuscular   Manufacturer: [employer], [employer]   Lot: [id number]   NDC: [address]-6

## 2019-09-03 ENCOUNTER — Ambulatory Visit: Payer: Self-pay

## 2019-11-11 ENCOUNTER — Other Ambulatory Visit: Payer: Self-pay | Admitting: Family Medicine

## 2019-11-11 DIAGNOSIS — E2839 Other primary ovarian failure: Secondary | ICD-10-CM

## 2019-11-24 ENCOUNTER — Other Ambulatory Visit: Payer: Self-pay | Admitting: Family Medicine

## 2019-11-24 DIAGNOSIS — Z1231 Encounter for screening mammogram for malignant neoplasm of breast: Secondary | ICD-10-CM

## 2020-02-18 ENCOUNTER — Other Ambulatory Visit: Payer: Self-pay

## 2020-02-18 ENCOUNTER — Ambulatory Visit
Admission: RE | Admit: 2020-02-18 | Discharge: 2020-02-18 | Disposition: A | Discharge status: Home / Self Care | Payer: 59 | Source: Ambulatory Visit | Attending: Family Medicine | Admitting: Family Medicine

## 2020-02-18 DIAGNOSIS — E2839 Other primary ovarian failure: Secondary | ICD-10-CM

## 2020-02-18 DIAGNOSIS — Z1231 Encounter for screening mammogram for malignant neoplasm of breast: Secondary | ICD-10-CM

## 2020-05-04 ENCOUNTER — Other Ambulatory Visit: Payer: Self-pay

## 2020-05-04 ENCOUNTER — Ambulatory Visit (INDEPENDENT_AMBULATORY_CARE_PROVIDER_SITE_OTHER): Payer: 59 | Admitting: Family Medicine

## 2020-05-04 DIAGNOSIS — M450 Ankylosing spondylitis of multiple sites in spine: Secondary | ICD-10-CM | POA: Diagnosis not present

## 2020-05-04 NOTE — Patient Instructions (Addendum)
-   Generally, an anti-inflammatory diet is relatively low in animal protein, especially processed meats, and high in vegetables and fruit.  It is also relatively low-glycemic (helps maintain stable blood sugar rather spiking blood sugar levels.    - Minimal high-glycemic carb foods such as those that are highly processed and lacking in fiber.    - See handouts provided today for foods to eat more of and less of for inflammation control.    Concerns with current intake:  - I have to wonder if the mango smoothies (with banana, a high-glycemic fruit, and oat milk, a poor source of protein) are contraindicated for inflammation b/c of their potential effect on blood glucose.  Although you enjoy the smoothies, you may want to experiment with eliminating them for a couple of weeks to see if you notice any change in symptoms (or try reducing the banana to 1/2 per smoothie, and substituting soy milk b/c of its protein content).   - Carrot juice is a high-glycemic beverage.  (I usually tell patients to EAT rather than drink fruits and vegetables!) - Maintaining your food and symptoms log will be instructive.    - Increase water intake to at least 48 ounces per day.  Start each day with 1-2 cups of water before you've even had breakfast.    - Now that you are not eating red meat, I do not think you need to supplement with iron unless your next lab work indicates a low hemoglobin/hematocrit.    - There is not a lot of good evidence in favor of the use of supplements to reduce inflammation; however, there is some for sure.  I recommend you see Jesusita Oka at [facility] [address], off of [address] by [address]: [phone]).   - If you have questions that come up based on what you read on the Internet or elsewhere, feel free to run them by me.

## 2020-05-04 NOTE — Progress Notes (Signed)
Medical Nutrition Therapy Appt start time: 1530 end time: 1630 (1 hour) Patient has completed 2nd dose of COVID-19 vaccine in March 2021. Primary concerns today: anti-inflammatory diet related to ankylosing spondolitis.  Relevant history/background: Patty Garrison was seen in 2019 for MNT related to restrictive eating.  A couple months ago she was diagnosed with ankylosing spondylitis for which she was prescribed [place].  She is not taking the medication, concerned re. the side effects profile, and wanting to optimize her diet for countering inflammation to the degree she can.    Note: Diet is already gluten-free, dairy-free, and low-acid (for GERD prevention).   Assessment:  [name] has experienced back pain since her 40's, and cannot take ibuprofen.  Sleep is severely hampered now b/c of pain.  She has been keeping a symptoms and food log for the past 3 weeks.  No trends or associations are apparent.  She has determined that she tends to snack when anxious, so then doesn't eat real meals.  Using the Decoding Q's recommended in 2019 has helped her manage this, and she feels quite good about current eating behaviors.  Also noticed that a substantial breakfast helps prevent mindless snacking.    Learning Readiness: Change in progress; has tried to increase anti-inflammatory foods and minimize those associated with inflammation.  Currently reading The Autoimmune Solution.  Has eliminated red meat, and is eating red grapes daily based on resources she has read.  Weight: 143 lb; up from 121 in 2019 (height 65").  BMI is now 23.8, probably a healthier weight for [employer].    Usual eating pattern: 3 meals and 2-3 snacks per day. Frequent foods and beverages: water, ~1 c carrot juice/day; oatmeal, 1/2 c blueberries, mango fruit smoothies (includes banana, oat milk), dried mango.    Avoided foods: Gluten, dairy, pre-digestion acidic foods such as citrus, garlic, raw onion, peppers.  Does still eat brocolic, spinach, carrots,  potatoes, mangos, blueberries.   Usual physical activity: Walks ~30 min 2-3 X wk.  Uses an 8-lb weight, mostly for shoulder stretching. Sleep: Poor sleep d/t pain; often only 3 hours.  On a "good" night she can sleep up to 6 hrs, but wakes about every 20 min.    24-hr recall: (Up at 6 AM) B (7 AM)-   1/2 c dry oatmeal, 1/2 c oat milk, 1/2 c fresh blueberries Snk ( AM)-    L (11 PM)-  1/2 chx brst, 1/2 c swt potato, water Snk (12 PM)-  1 c carrot juice Snk (4 PM)-  Smoothie; 1/2 c frzn mango, 1 banana, 1 c oat milk D (7:30 PM)-  1/2 chx brst, 1/2 c swt potato, water Snk (8:30)-  1 c red grapes Typical day? Yes.      Nutritional Diagnosis: NB-1.1 Food and nutrition-related knowledge deficit As related to anti-inflammatory diet.  As evidenced by request for help in determining what foods to choose to optimize anti-inflammotory effect.  Handouts given during visit include:  After-Visit Summary (AVS)  Two handouts on anti-inflammatory diet  Demonstrated degree of understanding via:  Teach Back  Barriers to learning/adherence to lifestyle change: Pain and sleep deprivation.    Monitoring/Evaluation:  Dietary intake, exercise, and body weight prn.

## 2020-12-16 ENCOUNTER — Ambulatory Visit: Payer: 59 | Attending: Internal Medicine

## 2020-12-16 DIAGNOSIS — Z20822 Contact with and (suspected) exposure to covid-19: Secondary | ICD-10-CM

## 2020-12-17 LAB — NOVEL CORONAVIRUS, NAA: SARS-CoV-2, NAA: NOT DETECTED

## 2020-12-17 LAB — SPECIMEN STATUS REPORT

## 2020-12-17 LAB — SARS-COV-2, NAA 2 DAY TAT

## 2021-02-03 ENCOUNTER — Other Ambulatory Visit (HOSPITAL_COMMUNITY)
Admission: RE | Admit: 2021-02-03 | Discharge: 2021-02-03 | Disposition: A | Discharge status: Home / Self Care | Payer: 59 | Source: Ambulatory Visit | Attending: Family Medicine | Admitting: Family Medicine

## 2021-02-03 DIAGNOSIS — Z124 Encounter for screening for malignant neoplasm of cervix: Secondary | ICD-10-CM | POA: Diagnosis present

## 2021-02-04 ENCOUNTER — Other Ambulatory Visit: Payer: Self-pay | Admitting: Family Medicine

## 2021-02-04 DIAGNOSIS — N939 Abnormal uterine and vaginal bleeding, unspecified: Secondary | ICD-10-CM

## 2021-02-10 ENCOUNTER — Ambulatory Visit
Admission: RE | Admit: 2021-02-10 | Discharge: 2021-02-10 | Disposition: A | Discharge status: Home / Self Care | Payer: 59 | Source: Ambulatory Visit | Attending: Family Medicine | Admitting: Family Medicine

## 2021-02-10 DIAGNOSIS — N939 Abnormal uterine and vaginal bleeding, unspecified: Secondary | ICD-10-CM

## 2021-02-10 LAB — CYTOLOGY - PAP
Comment: NEGATIVE
Diagnosis: NEGATIVE
Diagnosis: REACTIVE
High risk HPV: NEGATIVE

## 2021-06-30 ENCOUNTER — Ambulatory Visit: Payer: 59 | Admitting: Family Medicine

## 2021-07-04 ENCOUNTER — Other Ambulatory Visit: Payer: Self-pay | Admitting: Family Medicine

## 2021-07-04 DIAGNOSIS — R413 Other amnesia: Secondary | ICD-10-CM

## 2021-07-04 DIAGNOSIS — R42 Dizziness and giddiness: Secondary | ICD-10-CM

## 2021-07-05 ENCOUNTER — Other Ambulatory Visit: Payer: Self-pay | Admitting: *Deleted

## 2021-07-05 ENCOUNTER — Other Ambulatory Visit: Payer: Self-pay

## 2021-07-05 ENCOUNTER — Ambulatory Visit (INDEPENDENT_AMBULATORY_CARE_PROVIDER_SITE_OTHER): Payer: 59 | Admitting: Allergy and Immunology

## 2021-07-05 ENCOUNTER — Other Ambulatory Visit: Payer: Self-pay | Admitting: Allergy and Immunology

## 2021-07-05 ENCOUNTER — Other Ambulatory Visit: Payer: Self-pay | Admitting: Family Medicine

## 2021-07-05 VITALS — BP 110/62 | HR 98 | Temp 98.7°F | Resp 18 | Ht 65.0 in | Wt 117.0 lb

## 2021-07-05 DIAGNOSIS — R634 Abnormal weight loss: Secondary | ICD-10-CM

## 2021-07-05 DIAGNOSIS — T781XXA Other adverse food reactions, not elsewhere classified, initial encounter: Secondary | ICD-10-CM

## 2021-07-05 DIAGNOSIS — R413 Other amnesia: Secondary | ICD-10-CM

## 2021-07-05 DIAGNOSIS — J301 Allergic rhinitis due to pollen: Secondary | ICD-10-CM

## 2021-07-05 DIAGNOSIS — K219 Gastro-esophageal reflux disease without esophagitis: Secondary | ICD-10-CM

## 2021-07-05 DIAGNOSIS — J3089 Other allergic rhinitis: Secondary | ICD-10-CM

## 2021-07-05 DIAGNOSIS — R42 Dizziness and giddiness: Secondary | ICD-10-CM

## 2021-07-05 MED ORDER — LORATADINE 10 MG PO TABS: 10.0000 mg | ORAL_TABLET | Freq: Two times a day (BID) | ORAL | 5 refills | Status: DC | PRN

## 2021-07-05 MED ORDER — LORATADINE 10 MG PO TABS: 10.0000 mg | ORAL_TABLET | Freq: Every day | ORAL | 5 refills | Status: DC

## 2021-07-05 MED ORDER — TRIAMCINOLONE ACETONIDE 55 MCG/ACT NA AERO: 1.0000 | INHALATION_SPRAY | Freq: Every day | NASAL | 1 refills | Status: DC

## 2021-07-05 MED ORDER — MONTELUKAST SODIUM 10 MG PO TABS: 10.0000 mg | ORAL_TABLET | Freq: Every day | ORAL | 1 refills | Status: DC

## 2021-07-05 MED ORDER — OMEPRAZOLE 40 MG PO CPDR: 40.0000 mg | DELAYED_RELEASE_CAPSULE | Freq: Every day | ORAL | 5 refills | Status: DC

## 2021-07-05 NOTE — Progress Notes (Signed)
[place] [facility] [place] [facility] [place]   Dear Elvera Lennox,  Thank you for referring Patty Garrison to the [facility] [place] on 07/05/2021.   Below is a summation of this patient's evaluation and recommendations.  Thank you for your referral. I will keep you informed about this patient's response to treatment.   If you have any questions please do not hesitate to contact me.   Sincerely,  Jessica Priest, MD Allergy / Immunology [facility] [facility]   ______________________________________________________________________    NEW PATIENT NOTE  Referring Provider: Elvera Lennox, PA-C Primary Provider: Wilfrid Lund, PA Date of office visit: 07/05/2021    Subjective:   Chief Complaint:  Patty Garrison, 1967) is a 56 y.o. female who presents to the clinic on 07/05/2021 with a chief complaint of Asthma (7- 10 yrs: has been fine; well controlled) and Allergic Rhinitis  (7-10 years; has been getting worse; a lot more drainage; cough; Patient states she has switch to Claritin with some relief) .     HPI:  Patty Garrison presents to this clinic in evaluation of allergies and asthma.  I had apparently seen her in this clinic decades ago for a similar issue.  She states that for the most part she did well with her allergy and asthma until about 1 year ago.  She has noticed that since that point in time she has had some cough and some runny nose and some postnasal drip and some nasal congestion without any anosmia, ugly nasal discharge, exercise-induced bronchospastic symptoms, cold air induced bronchospastic symptoms, shortness of breath, chest tightness, with no obvious provoking factor giving rise to this issue.  She started therapy with Claritin and Nasacort and montelukast and she thinks that this has helped her symptoms somewhat.  Most of her cough is gone yet she still has postnasal drip and  throat clearing and still has some sneezing and some occasional runny nose.  She has a history of reflux.  She thinks that this is diet controlled for the most part.  She remains away from consumption of caffeine and chocolate and alcohol.  Apparently last year she was eating a lot of nuts and developed some facial swelling and redness that involves her face and down to her neck.  There was not an acute temporal relationship between consumption of these nuts and the development of this reaction but rather during a period in time when she was eating lots of nuts she noticed this issue and she thinks that it might have resolved around the same time she stopped eating all these nuts.  Past Medical History:  Diagnosis Date   Anxiety    Depression    PTSD (post-traumatic stress disorder)     Past Surgical History:  Procedure Laterality Date   BUNIONECTOMY     LAPAROSCOPIC ENDOMETRIOSIS FULGURATION     TONSILLECTOMY      Allergies as of 07/05/2021       Reactions   Cat Hair Extract    Dog Epithelium Allergy Skin Test    Molds & Smuts    Msud Aid [alitraq] Other (See Comments)   Nsaids Other (See Comments)   Other Swelling   Wheat Bran Other (See Comments)   Headache Stomach cramps        Medication List    albuterol 108 (90 Base) MCG/ACT inhaler Commonly known as: VENTOLIN HFA Inhale 1 puff into the lungs  as needed.   ALPRAZolam 0.25 MG tablet Commonly known as: XANAX Take 1 tablet by mouth as needed.   CALCIUM 1000 + D PO Take 1,000 mg by mouth daily.   cetirizine 10 MG tablet Commonly known as: ZYRTEC Take 10 mg by mouth daily.   Fish Oil 875 MG Caps Take 2 capsules by mouth daily at 12 noon.   fluticasone 50 MCG/ACT nasal spray Commonly known as: FLONASE Place 1 spray into both nostrils at bedtime.   loratadine 10 MG tablet Commonly known as: Claritin Take 1 tablet (10 mg total) by mouth 2 (two) times daily as needed for allergies.   metroNIDAZOLE 0.75 %  cream Commonly known as: METROCREAM Apply topically 2 (two) times daily.   montelukast 10 MG tablet Commonly known as: Singulair Take 1 tablet (10 mg total) by mouth daily at 12 noon.   multivitamin with minerals Tabs tablet Take 1 tablet by mouth daily.   omeprazole 40 MG capsule Commonly known as: PRILOSEC Take 1 capsule (40 mg total) by mouth daily. Started by: Jessica Priest, MD   PROBIOTIC DAILY PO Take 1 capsule by mouth daily.   T-Relief CBD+13 Subl Place 100 mg under the tongue daily at 12 noon.   triamcinolone 55 MCG/ACT Aero nasal inhaler Commonly known as: Nasacort Allergy 24HR Place 1 spray into the nose at bedtime.     Review of systems negative except as noted in HPI / PMHx or noted below:  Review of Systems  Constitutional: Negative.   HENT: Negative.    Eyes: Negative.   Respiratory: Negative.    Cardiovascular: Negative.   Gastrointestinal: Negative.   Genitourinary: Negative.   Musculoskeletal: Negative.   Skin: Negative.   Neurological: Negative.   Endo/Heme/Allergies: Negative.   Psychiatric/Behavioral: Negative.     Family History  Problem Relation Age of Onset   Breast cancer Neg Hx     Social History   Socioeconomic History   Marital status: Married    Spouse name: Not on file   Number of children: Not on file   Years of education: Not on file   Highest education level: Not on file  Occupational History   Not on file  Tobacco Use   Smoking status: Never   Smokeless tobacco: Never  Substance and Sexual Activity   Alcohol use: No   Drug use: No   Sexual activity: Never  Other Topics Concern   Not on file  Social History Narrative   Not on file   Environmental and Social history  Lives in a house with a dry environment, no animals look inside the household, carpet in the bedroom, plastic on the bed, plastic on the pillow, and no smoking ongoing with inside the household.  Objective:   Vitals:   07/05/21 0950  BP: 110/62   Pulse: 98  Resp: 18  Temp: 98.7 F (37.1 C)  SpO2: 97%   Height: 5\' 5"  (165.1 cm) Weight: 117 lb (53.1 kg)  Physical Exam Constitutional:      Appearance: She is not diaphoretic.  HENT:     Head: Normocephalic.     Right Ear: Tympanic membrane, ear canal and external ear normal.     Left Ear: Tympanic membrane, ear canal and external ear normal.     Nose: Nose normal. No mucosal edema or rhinorrhea.     Mouth/Throat:     Pharynx: Uvula midline. No oropharyngeal exudate.  Eyes:     Conjunctiva/sclera: Conjunctivae normal.  Neck:  Thyroid: No thyromegaly.     Trachea: Trachea normal. No tracheal tenderness or tracheal deviation.  Cardiovascular:     Rate and Rhythm: Normal rate and regular rhythm.     Heart sounds: Normal heart sounds, S1 normal and S2 normal. No murmur heard. Pulmonary:     Effort: No respiratory distress.     Breath sounds: Normal breath sounds. No stridor. No wheezing or rales.  Lymphadenopathy:     Head:     Right side of head: No tonsillar adenopathy.     Left side of head: No tonsillar adenopathy.     Cervical: No cervical adenopathy.  Skin:    Findings: No erythema or rash.     Nails: There is no clubbing.  Neurological:     Mental Status: She is alert.    Diagnostics: Allergy skin tests were performed.  She demonstrated hypersensitivity against house dust mite.  Spirometry was performed and demonstrated an FEV1 of 2.24 @ 82 % of predicted. FEV1/FVC = 0.72  Assessment and Plan:    1. Adverse food reaction, initial encounter   2. Perennial allergic rhinitis   3. Seasonal allergic rhinitis due to pollen   4. LPRD (laryngopharyngeal reflux disease)     1.  Allergen avoidance measures - dust mite  2.  Treat and prevent inflammation:  A. Montelukast 10 mg - 1 tablet one time per day B. Nasacort - 1-2 sprays each nostril 1 time per day  3. Treat and prevent LPR:  A. Omeprazole 40 mg - 1 tablet 1 time per day B. Eliminate fish oil  consumption  4. If needed:  A. Nasal saline B. Loratadine 10 mg - 1 tablet 1 time per day  5. Blood - nut IgE panel w/ R. Epi-pen???  6. 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  There may be an issue with adverse food reaction while consuming nuts and we will work through that issue in more detail by checking a IgE antigen specific antibodies against nut products.  If she comes up positive with that analysis then she will probably require an EpiPen to have on hand should she inadvertently consume and not and develop a reaction.  I will see her back in this clinic in 4 weeks or earlier if there is a problem.  Jessica Priest, MD Allergy / Immunology [facility] [place]

## 2021-07-05 NOTE — Patient Instructions (Addendum)
°  1.  Allergen avoidance measures - dust mite  2.  Treat and prevent inflammation:  A. Montelukast 10 mg - 1 tablet one time per day B. Nasacort - 1-2 sprays each nostril 1 time per day  3. Treat and prevent LPR:  A. Omeprazole 40 mg - 1 tablet 1 time per day B. Eliminate fish oil consumption  4. If needed:  A. Nasal saline B. Loratadine 10 mg - 1 tablet 1 time per day  5. Blood - nut IgE panel w/ R. Epi-pen???  6. Return to clinic in 4 weeks or earlier if problem

## 2021-07-06 ENCOUNTER — Encounter: Payer: Self-pay | Admitting: Allergy and Immunology

## 2021-07-11 LAB — IGE NUT PROF. W/COMPONENT RFLX
F017-IgE Hazelnut (Filbert): <0.1 kU/L
F018-IgE Brazil Nut: <0.1 kU/L
F020-IgE Almond: <0.1 kU/L
F202-IgE Cashew Nut: <0.1 kU/L
F203-IgE Pistachio Nut: <0.1 kU/L
F256-IgE Walnut: 0.41 kU/L — AB
Macadamia Nut, IgE: <0.1 kU/L
Peanut, IgE: <0.1 kU/L
Pecan Nut IgE: <0.1 kU/L

## 2021-07-11 LAB — PANEL 604721
Jug R 1 IgE: <0.1 kU/L
Jug R 3 IgE: <0.1 kU/L

## 2021-07-11 LAB — ALLERGEN COMPONENT COMMENTS

## 2021-07-13 ENCOUNTER — Other Ambulatory Visit: Payer: Self-pay | Admitting: *Deleted

## 2021-07-13 MED ORDER — EPINEPHRINE 0.3 MG/0.3ML IJ SOAJ: 0.3000 mg | Freq: Once | INTRAMUSCULAR | 1 refills | Status: AC

## 2021-07-15 ENCOUNTER — Ambulatory Visit
Admission: RE | Admit: 2021-07-15 | Discharge: 2021-07-15 | Disposition: A | Discharge status: Home / Self Care | Payer: 59 | Source: Ambulatory Visit | Attending: Sports Medicine | Admitting: Sports Medicine

## 2021-07-15 ENCOUNTER — Other Ambulatory Visit: Payer: Self-pay

## 2021-07-15 ENCOUNTER — Other Ambulatory Visit: Payer: Self-pay | Admitting: Sports Medicine

## 2021-07-15 DIAGNOSIS — M25551 Pain in right hip: Secondary | ICD-10-CM

## 2021-07-15 DIAGNOSIS — M25552 Pain in left hip: Secondary | ICD-10-CM

## 2021-07-17 ENCOUNTER — Other Ambulatory Visit: Payer: 59

## 2021-07-19 ENCOUNTER — Ambulatory Visit (INDEPENDENT_AMBULATORY_CARE_PROVIDER_SITE_OTHER): Payer: 59 | Admitting: Family Medicine

## 2021-07-19 ENCOUNTER — Other Ambulatory Visit: Payer: Self-pay

## 2021-07-19 DIAGNOSIS — R634 Abnormal weight loss: Secondary | ICD-10-CM | POA: Diagnosis not present

## 2021-07-19 NOTE — Progress Notes (Signed)
Medical Nutrition Therapy Patient has completed 2nd dose of COVID-19 vaccine. Appt start time: 0930 end time: 1030 (1 hour) Primary concerns today: Weight management.   Relevant history/background: Patty Garrison was seen in 2019 for MNT related to restrictive eating, and again in 2021 following a dx of ankylosing spondylitis.  Patty Garrison wanted to learn more about an anti-inflammatory diet for this condition.  For the past year, Patty Garrison has been following a Paleo diet, which has reduced arthritis pain ~90%.   Patty Garrison had surgery in mid-November for which Patty Garrison discontinued a medication that had increased her appetite.  By December Patty Garrison noticed an almost 20-lb weight loss, which Patty Garrison wants to address today.  Patty Garrison frequently experiences lightheadedness, and her weight has declined from 143 lb in Dec 2021 to 117 on 07/05/21.  Patty Garrison wants to add back some kcal, but doesn't want to un-do benefits of Paleo diet.  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   Note: Diet is also gluten-free, dairy-free, and low-acid (for GERD prevention).   Assessment:   Patty Garrison read the book, "The Autoimmune Solution," which advised following a Paleo diet.  Patty Garrison actually eats a modified Paleo diet, still including fruit and veg's daily, but has eliminated grains and legumes.  Patty Garrison also aims for high-pH foods to help her reflux.  Learning Readiness: Change in progress  Weight: 119.4 lb (117 lb on 07/05/21; 143 on 12/7); ht is 65".  Usual eating pattern: 3 meals and 2 snacks per day. Frequent foods and beverages: water; bkfst: chia seed in oat milk, sunflower butter, blueberries, banana; lunch and dinner: meat & veg's.   Avoided foods: tomatoes, peppers, garlic, onion, strawberries, raspberries, citrus, processed meats, dairy (lactose intol), grains, beans, soy foods.   Usual physical activity: walking ~45 min 1-2 X wk; 30 min on TM at gym 2 X wk; stretching/PT for hip/back pain.    Sleep: Estimates Patty Garrison gets  ~4-5 hrs/night.  CBD oil has helped.   24-hr recall: (Up at 6:30 AM) B (7 AM)-   1/2 of (1/3 c chia seeds in 1 1/2 c oat milk), 1 hpng tsp sunflower butter, 1/3 c blueberries, 1/2 pear, water Snk ( AM)-   water L (12 PM)-  1 slc quiche, 3 seed crackers, 1/3 c swt potoatoes, 3/4 c grapes, water Snk (3:30)-  1/4 c dried mango, 1/3 c grapes, water D (6 PM)-  3-4 oz cube steak,1 c broccoli, 2 tbsp dairy-free cheese, water Snk (8 PM)-  3 seed crackers, water Typical day? Yes.   Drinks ~64 oz water/day.    Nutritional Diagnosis:  NI-1.4 Inadequate energy intake As related to multiple food intolerances.  As evidenced by ~25-lb weight loss since December.  Handouts given during visit include: After-Visit Summary (AVS)  Demonstrated degree of understanding via:  Teach Back  Barriers to learning/adherence to lifestyle change: Multiple food intolerances, inadequate sleep.    Monitoring/Evaluation:  Dietary intake, exercise, and body weight prn.

## 2021-07-19 NOTE — Patient Instructions (Addendum)
Maintaining a mainly Paleo diet while also being able to keep a healthy weight for you will probably require adding some high-quality fats.    - Healthy fats: XV olive oil, canola oil, avocado, as well as nut/seed oils and fish oil .   - Add oils to chia pudding, salads, veg's, sunflower butter, sweet potatoes.   - Increase use of avocado in meals.    Protein sources:  - Consider cooking a [place] breast as a lean source of protein.    - Pork tenderloin is actually lean.    - A portion size of 3-4 oz of meat is appropriate for you.  Having much larger portions will increase your intake of saturated fat, which will increase LDL.    Vegetables: Aim for including non-starchy veg's at both lunch and dinner.  Emphasize leafy greens whenever possible.    (Mono-unsaturated fats [olive, canola, avocado] will decrease LDL while NOT decreasing HDL.  Polyunsaturated fats [most nut & seed oils, corn, soy oil] will decrease ALL cholesterol fractions, including HDL.)   I would suggest you experiment with adding back beans to your diet.  Carefully track (written record) on a symptoms log.    Summer watermelon:  Watermelon has a high glycemic index (80). But a serving of watermelon has so little carbohydrate, its glycemic load is only 5.  Recognize that it is generally best to get variety in the foods you eat.

## 2021-08-15 ENCOUNTER — Ambulatory Visit: Payer: 59 | Admitting: Family

## 2021-08-21 NOTE — Patient Instructions (Addendum)
?  1.  Allergen avoidance measures - dust mite ? ?2.  Treat and prevent inflammation: ? ?A. Montelukast 10 mg - 1 tablet one time per day ?B. Nasacort - 1-2 sprays each nostril 1 time per day ? ?3. Treat and prevent LPR: ? ?A.Continue dietary and lifestyle modifications ? ? ?4. If needed: ? ?A. Nasal saline ?B. Loratadine 10 mg - 1 tablet 1 time per day ? ?5.Avoid tree nuts. In case of an allergic reaction, give Benadryl 4 teaspoonfuls every 6 hours, and if life-threatening symptoms occur, inject with EpiPen 0.3 mg. ?If reaction occurs again take a photo. ?If your symptoms re-occur, begin a journal of events that occurred for up to 6 hours before your symptoms began including foods and beverages consumed, soaps or perfumes you had contact with, and medications.  ? ? ?6. Return to clinic in 4-6 months or earlier if problem ?

## 2021-08-22 ENCOUNTER — Other Ambulatory Visit: Payer: Self-pay

## 2021-08-22 ENCOUNTER — Ambulatory Visit (INDEPENDENT_AMBULATORY_CARE_PROVIDER_SITE_OTHER): Payer: 59 | Admitting: Family

## 2021-08-22 ENCOUNTER — Encounter: Payer: Self-pay | Admitting: Family

## 2021-08-22 VITALS — BP 114/60 | HR 79 | Temp 98.6°F | Resp 16 | Ht 65.0 in | Wt 118.0 lb

## 2021-08-22 DIAGNOSIS — T781XXA Other adverse food reactions, not elsewhere classified, initial encounter: Secondary | ICD-10-CM

## 2021-08-22 DIAGNOSIS — K219 Gastro-esophageal reflux disease without esophagitis: Secondary | ICD-10-CM

## 2021-08-22 DIAGNOSIS — J3089 Other allergic rhinitis: Secondary | ICD-10-CM

## 2021-08-22 NOTE — Progress Notes (Signed)
? ?[address] ?[place] [address] ?Dept: [phone] ? ?FOLLOW UP NOTE ? ?Patient ID: Patty Garrison, female    DOB: 10/15/1965  Age: 56 y.o. MRN: [id number] ?Date of Office Visit: 08/22/2021 ? ?Assessment  ?Chief Complaint: Follow-up (Has some questions about the allergy test results but that is about it per patient.) ? ?HPI ?Patty Garrison 56 year old female who presents today for follow-up of adverse food reaction, perennial and seasonal allergic rhinitis, and laryngeal pharyngeal reflux disease.  She was last seen on July 05, 2021 by Dr. Lucie Leather.  Since her last office visit she denies any new diagnosis or surgeries. ? ?She reports that she does not like walnuts and does not eat them but mentions that if she eats almonds or pistachios the skin on her face will get dry flaky and eventually red and swollen.  This then spreads down her neck ears and chest.  She denies any concomitant cardiorespiratory and gastrointestinal symptoms when this occurs.  She reports that this last Thursday she ate a little bit of almonds and pistachios on Thursday, Friday, and Saturday and then noticed on Sunday that her skin started getting dry and flaky.  She does not try separating the nuts and does not eat peanut butter.  Years ago she switched to sun better.  She has not had to use her EpiPen since we last saw her. ? ?Seasonal and perennial allergic rhinitis is reported as controlled with Singulair 10 mg once a day, Nasacort 1 to 2 sprays each nostril once a day and loratadine as needed.  She reports approximately 1 to 2 weeks ago that she stopped the loratadine and so far has not had any problems.  She denies rhinorrhea, nasal congestion, and postnasal drip.  She has not had any sinus infections since we last saw her.  She has questions about what her skin test was positive to at her last office visit.  Instructed her that her skin testing on July 05, 2021 was positive to dust mite. ? ?Laryngopharyngeal reflux disease  is reported as controlled with no medication at this time.  She reports that she stopped taking fish oil and also stopped taking Prilosec 1-1/2 to 2 weeks ago and she has not been having any drainage. ? ? ?Drug Allergies:  ?Allergies  ?Allergen Reactions  ? Cat Hair Extract   ? Dog Epithelium Allergy Skin Test   ? Molds & Smuts   ? Msud Aid [Alitraq] Other (See Comments)  ? Nsaids Other (See Comments)  ? Other Swelling  ? Wheat Bran Other (See Comments)  ?  Headache ?Stomach cramps  ? ? ?Review of Systems: ?Review of Systems  ?Constitutional:  Negative for chills and fever.  ?HENT:    ?     Denies rhinorrhea, nasal congestion, and postnasal drip  ?Eyes:   ?     Denies itchy watery eyes  ?Respiratory:  Negative for cough, shortness of breath and wheezing.   ?Cardiovascular:  Negative for chest pain and palpitations.  ?Gastrointestinal:   ?     Denies heartburn or reflux symptoms  ?Genitourinary:  Negative for frequency.  ?Skin:  Negative for itching and rash.  ?Neurological:  Negative for headaches.  ?Endo/Heme/Allergies:  Positive for environmental allergies.  ? ? ?Physical Exam: ?BP 114/60   Pulse 79   Temp 98.6 ?F (37 ?C) (Temporal)   Resp 16   Ht 5\' 5"  (1.651 m)   Wt 118 lb (53.5 kg)   SpO2 98%   BMI 19.64  kg/m?   ? ?Physical Exam ?Constitutional:   ?   Appearance: Normal appearance.  ?HENT:  ?   Head: Normocephalic and atraumatic.  ?   Comments: Pharynx normal, eyes normal, ears: Unable to see right tympanic membrane due to cerumen.  Left ear normal, nose normal ?   Right Ear: Ear canal and external ear normal.  ?   Left Ear: Tympanic membrane, ear canal and external ear normal.  ?   Nose: Nose normal.  ?   Mouth/Throat:  ?   Mouth: Mucous membranes are moist.  ?   Pharynx: Oropharynx is clear.  ?Eyes:  ?   Conjunctiva/sclera: Conjunctivae normal.  ?Cardiovascular:  ?   Rate and Rhythm: Normal rate and regular rhythm.  ?   Heart sounds: Normal heart sounds.  ?Pulmonary:  ?   Effort: Pulmonary effort is  normal.  ?   Breath sounds: Normal breath sounds.  ?   Comments: Lungs clear to auscultation ?Musculoskeletal:  ?   Cervical back: Neck supple.  ?Skin: ?   General: Skin is warm.  ?Neurological:  ?   Mental Status: She is alert and oriented to person, place, and time.  ?Psychiatric:     ?   Mood and Affect: Mood normal.     ?   Behavior: Behavior normal.     ?   Thought Content: Thought content normal.     ?   Judgment: Judgment normal.  ? ? ?Diagnostics: ?None ? ?Assessment and Plan: ?1. Adverse food reaction, initial encounter   ?2. Perennial allergic rhinitis   ?3. LPRD (laryngopharyngeal reflux disease)   ? ? ?No orders of the defined types were placed in this encounter. ? ? ?Patient Instructions  ? ?1.  Allergen avoidance measures - dust mite ? ?2.  Treat and prevent inflammation: ? ?A. Montelukast 10 mg - 1 tablet one time per day ?B. Nasacort - 1-2 sprays each nostril 1 time per day ? ?3. Treat and prevent LPR: ? ?A.Continue dietary and lifestyle modifications ? ? ?4. If needed: ? ?A. Nasal saline ?B. Loratadine 10 mg - 1 tablet 1 time per day ? ?5.Avoid tree nuts. In case of an allergic reaction, give Benadryl 4 teaspoonfuls every 6 hours, and if life-threatening symptoms occur, inject with EpiPen 0.3 mg. ?If reaction occurs again take a photo. ?If your symptoms re-occur, begin a journal of events that occurred for up to 6 hours before your symptoms began including foods and beverages consumed, soaps or perfumes you had contact with, and medications.  ? ? ?6. Return to clinic in 4-6 months or earlier if problem ?Return in about 6 months (around 02/22/2022), or if symptoms worsen or fail to improve. ?  ? ?Thank you for the opportunity to care for this patient.  Please do not hesitate to contact me with questions. ? ?Nehemiah Settle, FNP ?[facility] ? ? ? ? ?

## 2021-10-04 ENCOUNTER — Ambulatory Visit: Payer: 59 | Admitting: Allergy and Immunology

## 2021-11-26 IMAGING — US US PELVIS COMPLETE WITH TRANSVAGINAL
1 series · 13 of 25 positions shown · non-contrast
Comparison: None

CLINICAL DATA: Abnormal uterine bleeding

EXAM:
TRANSABDOMINAL AND TRANSVAGINAL ULTRASOUND OF PELVIS
TECHNIQUE: Both transabdominal and transvaginal ultrasound examinations of the
pelvis were performed. Transabdominal technique was performed for
global imaging of the pelvis including uterus, ovaries, adnexal
regions, and pelvic cul-de-sac. It was necessary to proceed with
endovaginal exam following the transabdominal exam to visualize the
uterus endometrium ovaries.

[Series 1: us pelvis complete with transvaginal · 0.19mm/px · 59 acquisitions, 13 frames shown]
[im 1/59]
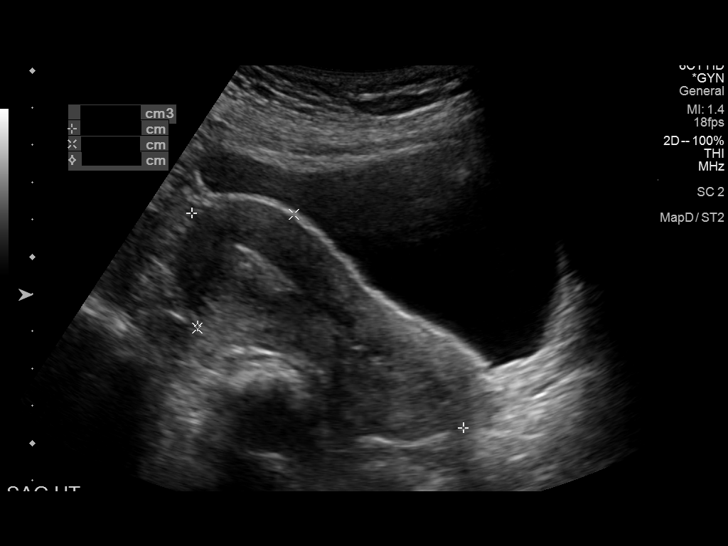
[im 5/59]
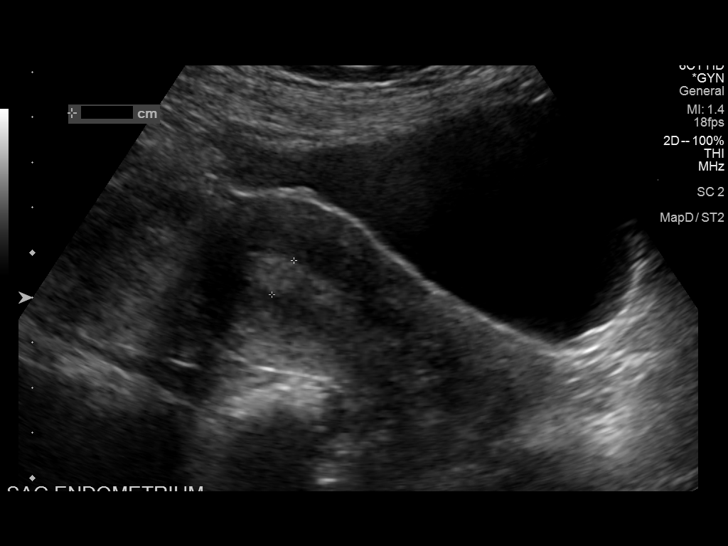
[im 10/59]
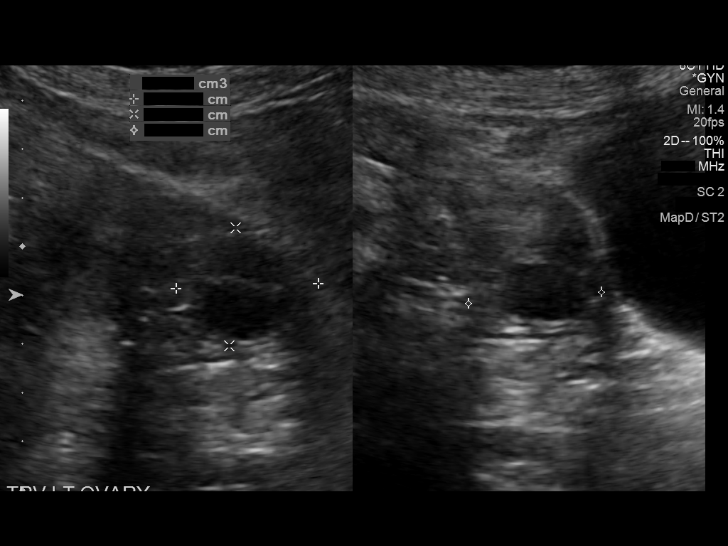
[im 15/59]
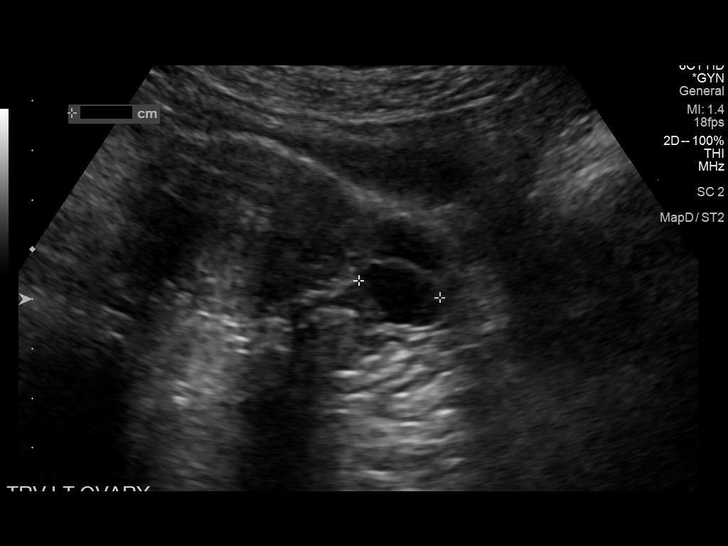
[im 20/59]
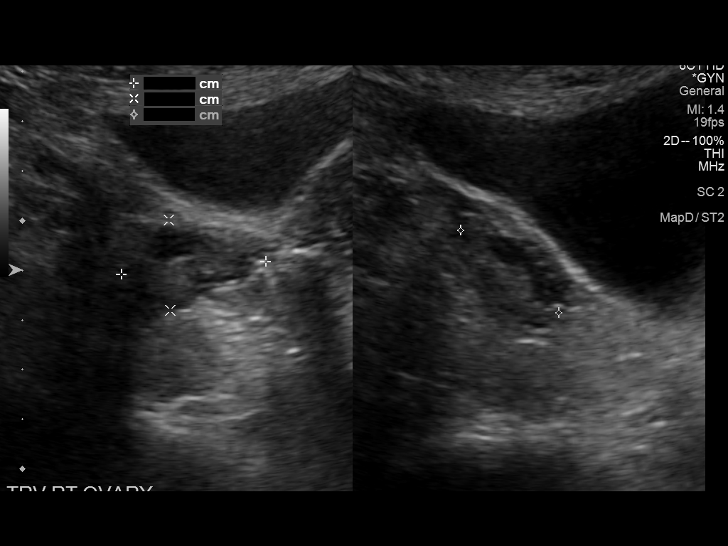
[im 25/59]
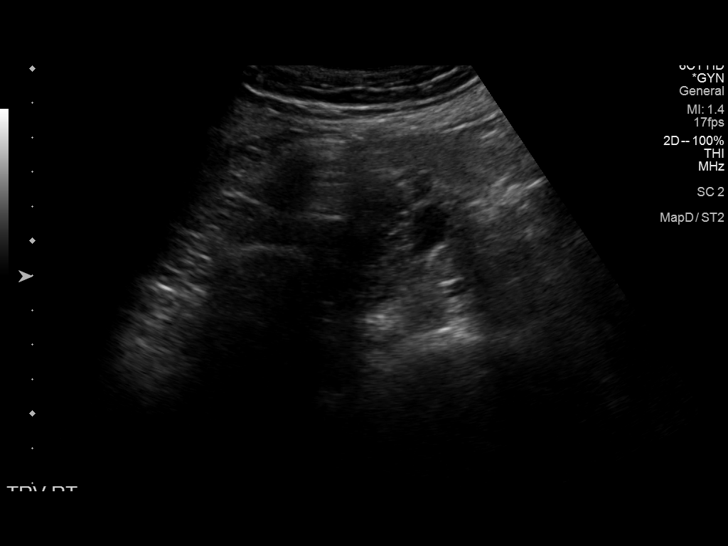
[im 30/59]
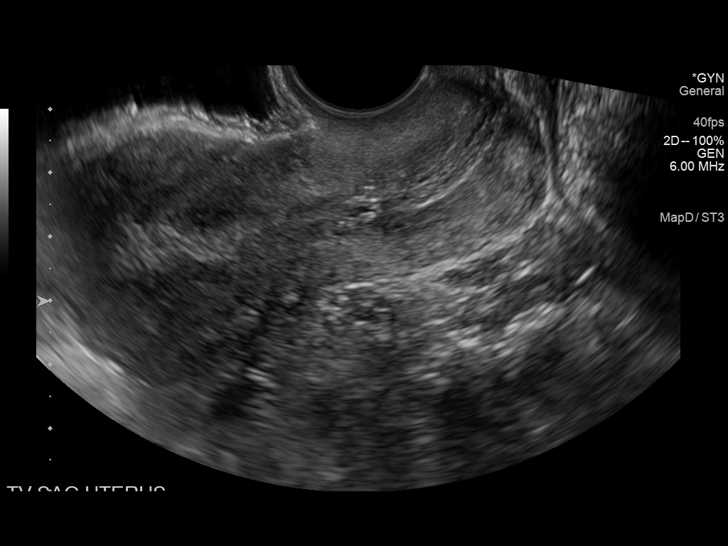
[im 34/59]
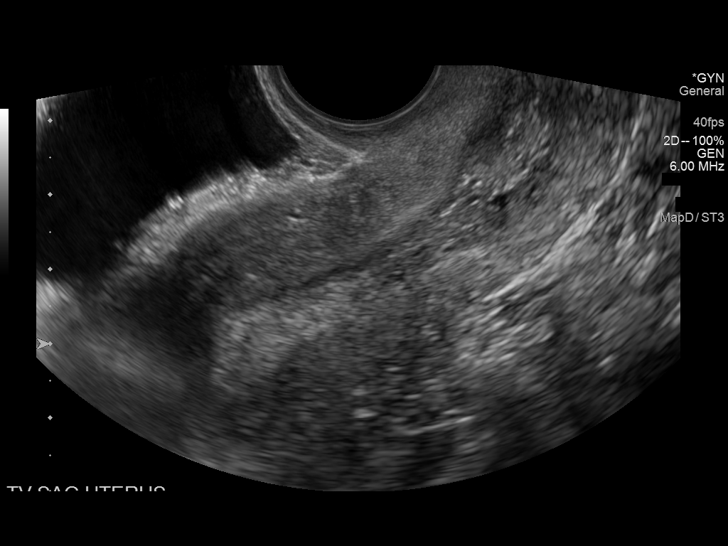
[im 39/59]
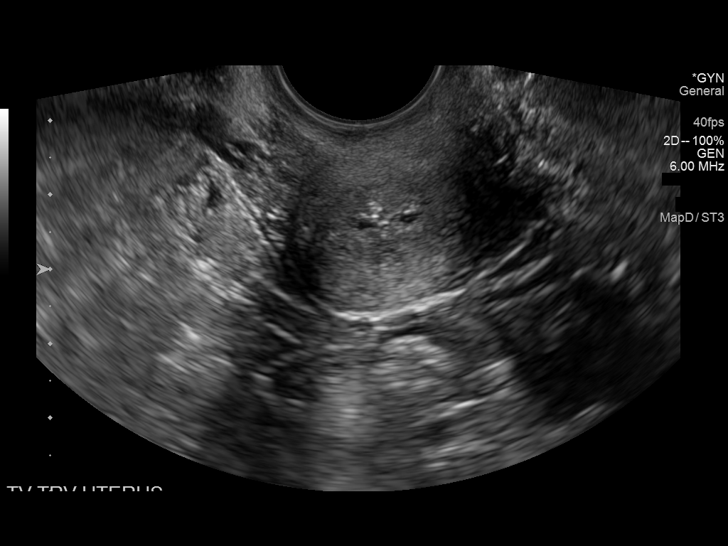
[im 44/59]
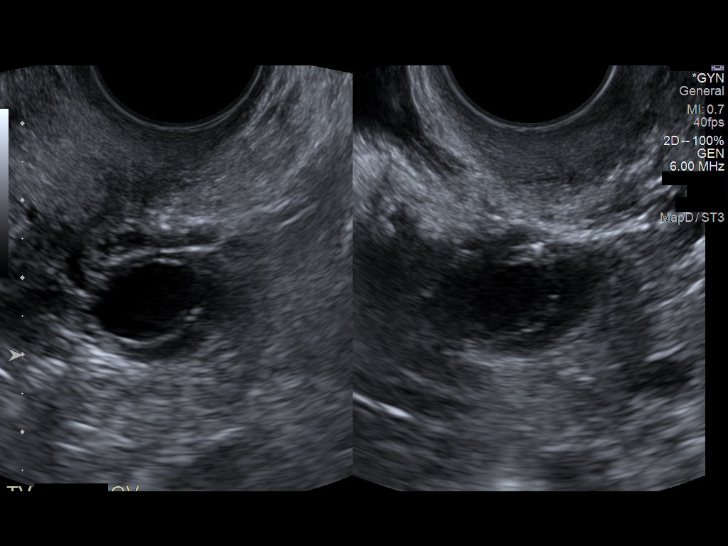
[im 49/59]
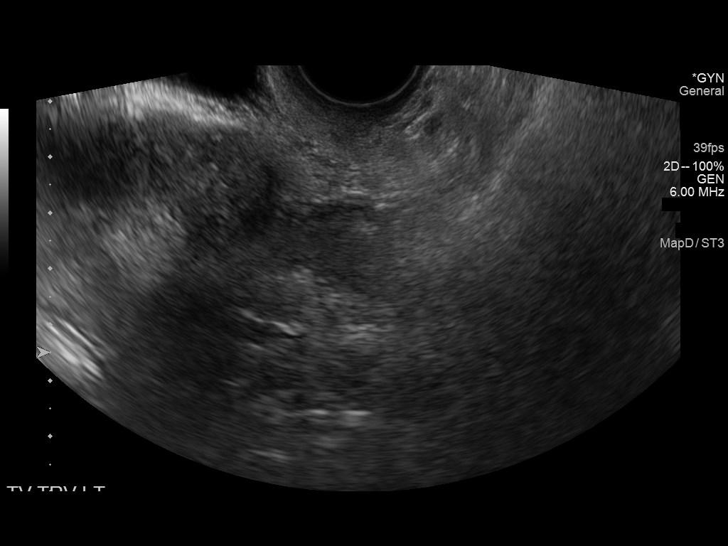
[im 54/59]
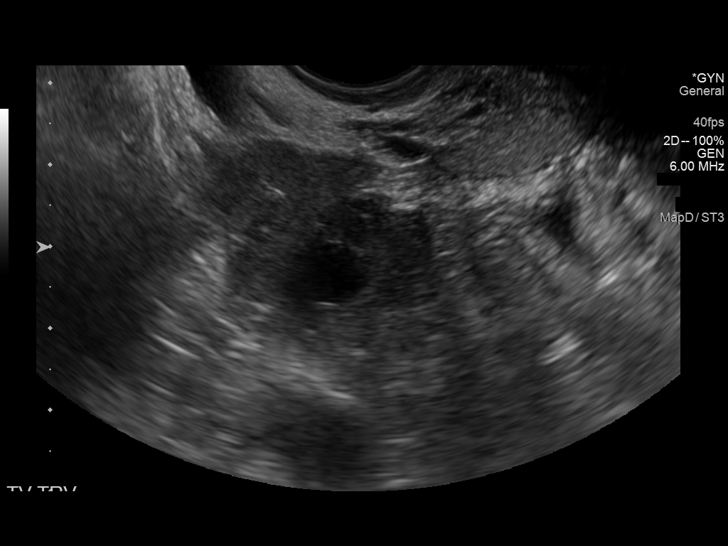
[im 59/59]
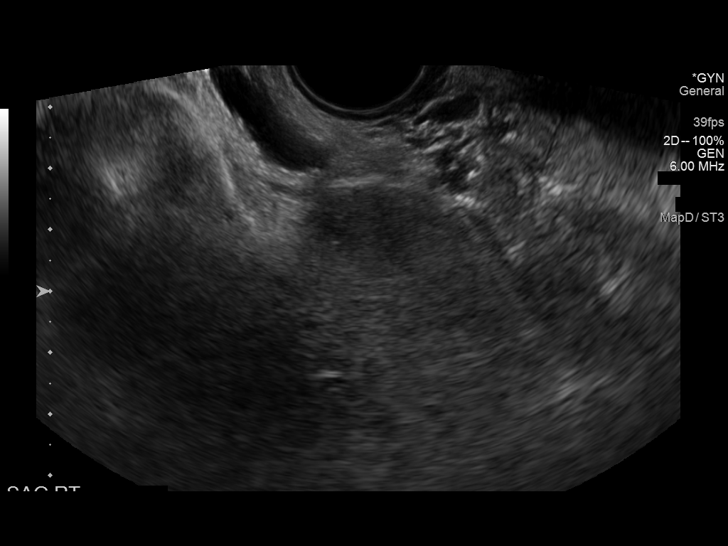

[13 of 25 positions shown; findings below may reference images not displayed]

FINDINGS: Uterus

Measurements: 9.3 x 4 x 5.1 cm = volume: 98 mL. No fibroids or other
mass visualized.

Endometrium

Thickness: 9.9 mm.  No focal abnormality visualized.

Right ovary

Measurements: 2.6 x 1.4 x 2 cm = volume: 3.7 mL. Normal
appearance/no adnexal mass.

Left ovary

Measurements: 2.3 x 1.5 x 2.4 cm = volume: 4.2 mL. Normal
appearance/no adnexal mass.

Other findings

No abnormal free fluid.
IMPRESSION: 1. Endometrial thickness of 9.9 mm. If bleeding remains unresponsive
to hormonal or medical therapy, sonohysterogram should be considered
for focal lesion work-up. (Ref: Radiological Reasoning: Algorithmic
Workup of Abnormal Vaginal Bleeding with Endovaginal Sonography and
Sonohysterography. AJR 2111; 191:S68-73)
2. Otherwise negative pelvic ultrasound

## 2022-02-21 NOTE — Progress Notes (Deleted)
Follow Up Note  RE: Patty Garrison MRN: [id number] DOB: November 08, 1965 Date of Office Visit: 02/22/2022  Referring provider: Wilfrid Lund, PA Primary care provider: Wilfrid Lund, PA  Chief Complaint: No chief complaint on file.  History of Present Illness: I had the pleasure of seeing Patty Garrison for a follow up visit at the [facility] on 02/21/2022. She is a 56 y.o. female, who is being followed for adverse food reaction, allergic rhinitis and LPRD. Her previous allergy office visit was on 08/22/2021 with Nehemiah Settle FNP. Today is a regular follow up visit.  . Adverse food reaction, initial encounter   2. Perennial allergic rhinitis   3. LPRD (laryngopharyngeal reflux disease)       No orders of the defined types were placed in this encounter.     Patient Instructions    1.  Allergen avoidance measures - dust mite  2.  Treat and prevent inflammation:   A. Montelukast 10 mg - 1 tablet one time per day B. Nasacort - 1-2 sprays each nostril 1 time per day  3. Treat and prevent LPR:   A.Continue dietary and lifestyle modifications   4. If needed:   A. Nasal saline B. Loratadine 10 mg - 1 tablet 1 time per day  5.Avoid tree nuts. In case of an allergic reaction, give Benadryl 4 teaspoonfuls every 6 hours, and if life-threatening symptoms occur, inject with EpiPen 0.3 mg. If reaction occurs again take a photo. If your symptoms re-occur, begin a journal of events that occurred for up to 6 hours before your symptoms began including foods and beverages consumed, soaps or perfumes you had contact with, and medications.   2023 bloodwork was positive to walnuts. 2023 intradermal testing was positive to dust mites.  Assessment and Plan: Patty Garrison is a 56 y.o. female with: No problem-specific Assessment & Plan notes found for this encounter.  No follow-ups on file.  No orders of the defined types were placed in this encounter.  Lab Orders  No laboratory  test(s) ordered today    Diagnostics: Spirometry:  Tracings reviewed. Her effort: {Blank single:19197::"Good reproducible efforts.","It was hard to get consistent efforts and there is a question as to whether this reflects a maximal maneuver.","Poor effort, data can not be interpreted."} FVC: ***L FEV1: ***L, ***% predicted FEV1/FVC ratio: ***% Interpretation: {Blank single:19197::"Spirometry consistent with mild obstructive disease","Spirometry consistent with moderate obstructive disease","Spirometry consistent with severe obstructive disease","Spirometry consistent with possible restrictive disease","Spirometry consistent with mixed obstructive and restrictive disease","Spirometry uninterpretable due to technique","Spirometry consistent with normal pattern","No overt abnormalities noted given today's efforts"}.  Please see scanned spirometry results for details.  Skin Testing: {Blank single:19197::"Select foods","Environmental allergy panel","Environmental allergy panel and select foods","Food allergy panel","None","Deferred due to recent antihistamines use"}. *** Results discussed with patient/family.   Medication List:  Current Outpatient Medications  Medication Sig Dispense Refill   albuterol (VENTOLIN HFA) 108 (90 Base) MCG/ACT inhaler Inhale 1 puff into the lungs as needed.     ALPRAZolam (XANAX) 0.25 MG tablet Take 1 tablet by mouth as needed.     Calcium Carb-Cholecalciferol (CALCIUM 1000 + D PO) Take 1,000 mg by mouth daily.     cetirizine (ZYRTEC) 10 MG tablet Take 10 mg by mouth daily. (Patient not taking: Reported on 07/05/2021)     EPINEPHrine 0.3 mg/0.3 mL IJ SOAJ injection SMARTSIG:0.3 Milliliter(s) IM Once PRN     loratadine (CLARITIN) 10 MG tablet Take 1 tablet (10 mg total) by mouth daily. 30  tablet 5   metroNIDAZOLE (METROCREAM) 0.75 % cream Apply topically 2 (two) times daily.     Misc Natural Products (T-RELIEF CBD+13) SUBL Place 100 mg under the tongue daily at 12  noon.     montelukast (SINGULAIR) 10 MG tablet Take 1 tablet (10 mg total) by mouth daily at 12 noon. 90 tablet 1   Multiple Vitamin (MULTIVITAMIN WITH MINERALS) TABS tablet Take 1 tablet by mouth daily.     omeprazole (PRILOSEC) 40 MG capsule Take 1 capsule (40 mg total) by mouth daily. 30 capsule 5   Probiotic Product (PROBIOTIC DAILY PO) Take 1 capsule by mouth daily.     triamcinolone (NASACORT ALLERGY 24HR) 55 MCG/ACT AERO nasal inhaler Place 1 spray into the nose at bedtime. 1 each 1   No current facility-administered medications for this visit.   Allergies: Allergies  Allergen Reactions   Cat Hair Extract    Dog Epithelium Allergy Skin Test    Molds & Smuts    Msud Aid [Alitraq] Other (See Comments)   Nsaids Other (See Comments)   Other Swelling   Wheat Bran Other (See Comments)    Headache Stomach cramps   I reviewed her past medical history, social history, family history, and environmental history and no significant changes have been reported from her previous visit.  Review of Systems  Constitutional:  Negative for appetite change, chills, fever and unexpected weight change.  HENT:  Negative for congestion and rhinorrhea.   Eyes:  Negative for itching.  Respiratory:  Negative for cough, chest tightness, shortness of breath and wheezing.   Gastrointestinal:  Negative for abdominal pain.  Skin:  Negative for rash.  Allergic/Immunologic: Positive for environmental allergies and food allergies.  Neurological:  Negative for headaches.    Objective: There were no vitals taken for this visit. There is no height or weight on file to calculate BMI. Physical Exam Vitals and nursing note reviewed.  Constitutional:      Appearance: Normal appearance. She is well-developed.  HENT:     Head: Normocephalic and atraumatic.     Right Ear: Tympanic membrane and external ear normal.     Left Ear: Tympanic membrane and external ear normal.     Nose: Nose normal.      Mouth/Throat:     Mouth: Mucous membranes are moist.     Pharynx: Oropharynx is clear.  Eyes:     Conjunctiva/sclera: Conjunctivae normal.  Cardiovascular:     Rate and Rhythm: Normal rate and regular rhythm.     Heart sounds: Normal heart sounds. No murmur heard. Pulmonary:     Effort: Pulmonary effort is normal.     Breath sounds: Normal breath sounds. No wheezing, rhonchi or rales.  Musculoskeletal:     Cervical back: Neck supple.  Skin:    General: Skin is warm.     Findings: No rash.  Neurological:     Mental Status: She is alert and oriented to person, place, and time.  Psychiatric:        Behavior: Behavior normal.    Previous notes and tests were reviewed. The plan was reviewed with the patient/family, and all questions/concerned were addressed.  It was my pleasure to see Patty Garrison today and participate in her care. Please feel free to contact me with any questions or concerns.  Sincerely,  Rexene Alberts, DO Allergy & Immunology  [facility] office: [place] office: [phone]

## 2022-02-22 ENCOUNTER — Ambulatory Visit: Payer: 59 | Admitting: Allergy

## 2022-04-12 ENCOUNTER — Other Ambulatory Visit: Payer: Self-pay | Admitting: Family Medicine

## 2022-04-12 DIAGNOSIS — E2839 Other primary ovarian failure: Secondary | ICD-10-CM

## 2022-04-30 IMAGING — CR DG HIP (WITH OR WITHOUT PELVIS) 3-4V BILAT
3 series · 3 of 3 positions shown · non-contrast
Comparison: None.

CLINICAL DATA: Worsening bilateral hip pain.

EXAM:
DG HIP (WITH OR WITHOUT PELVIS) 3-4V BILAT

[w pelvis upright]
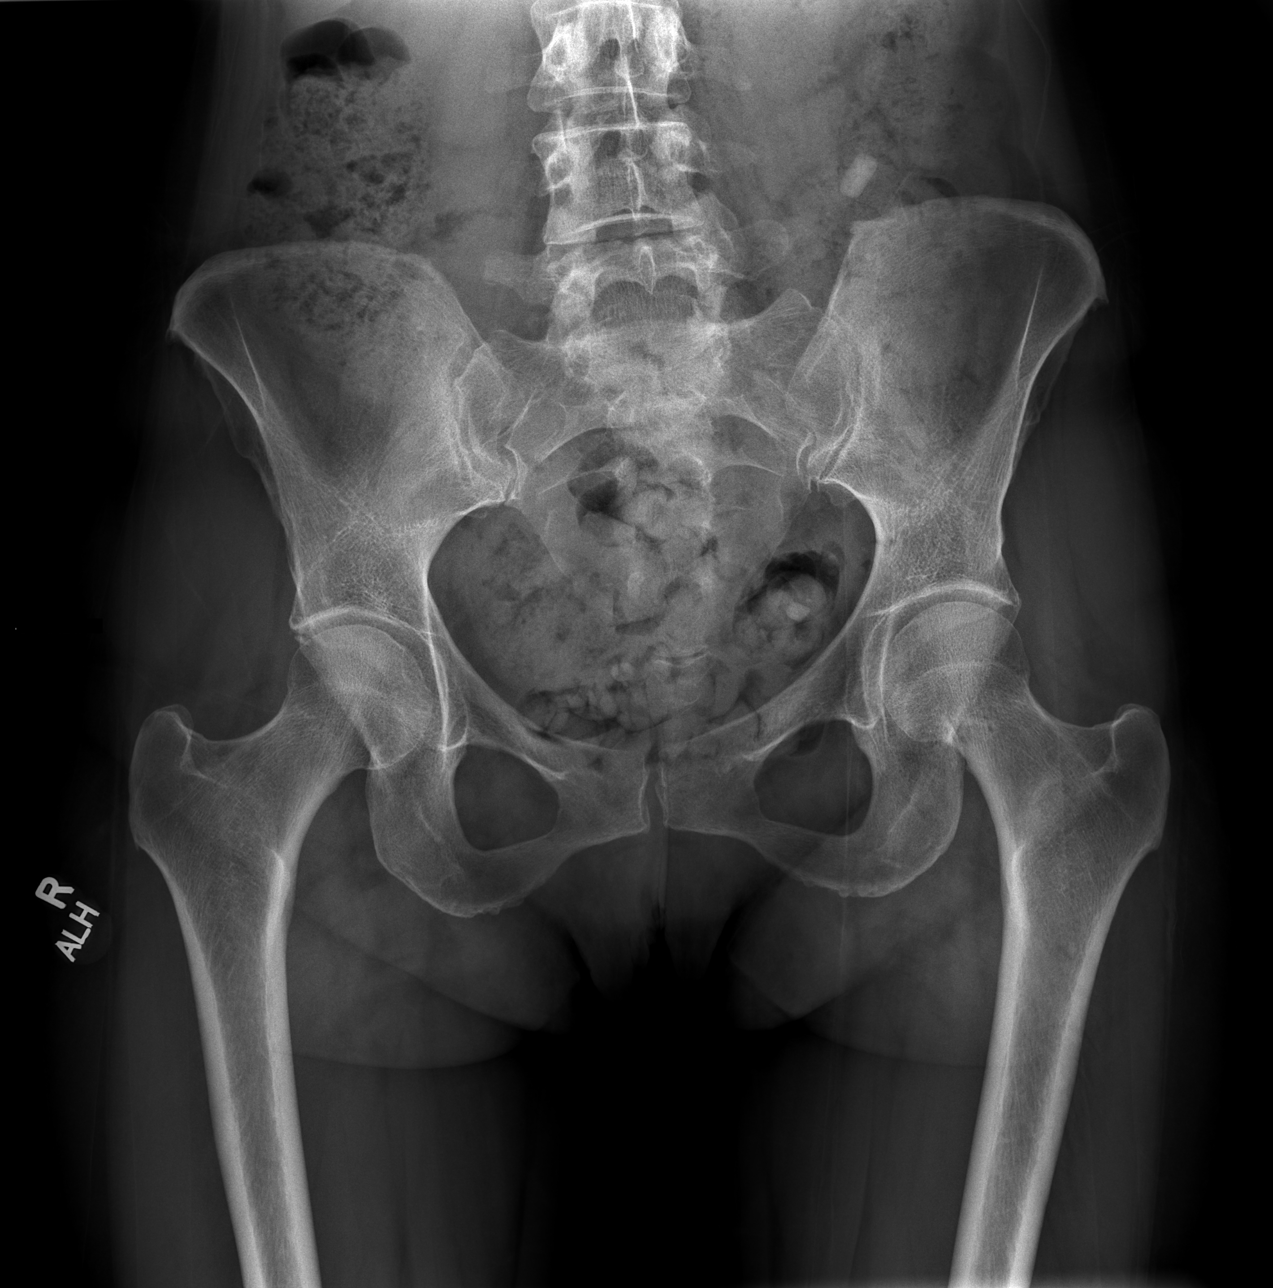

[w hip frog left]
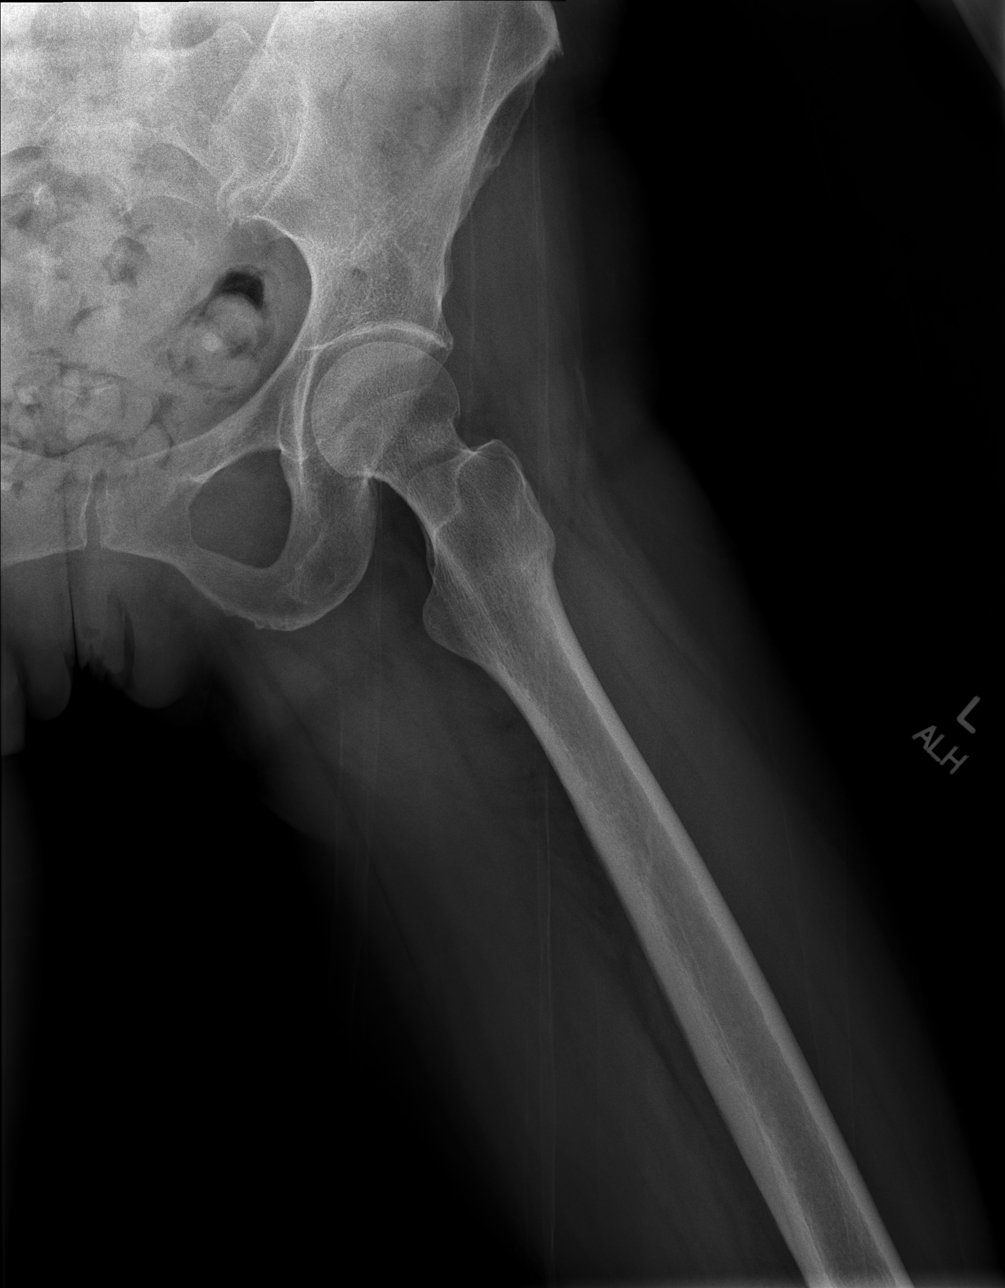

[w hip frog right]
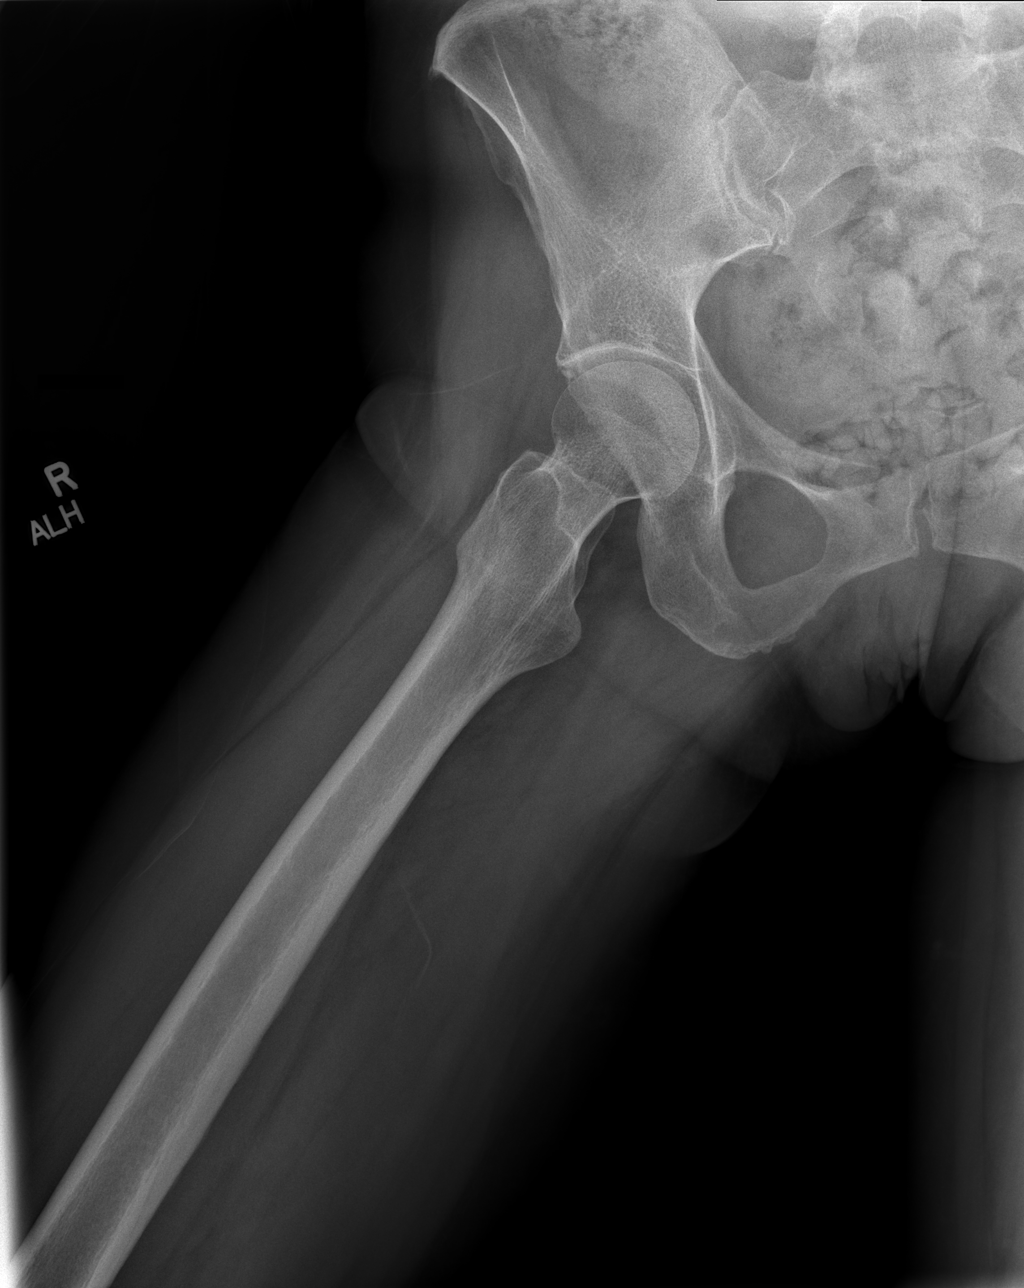

[3 of 3 positions shown; findings below may reference images not displayed]

FINDINGS: There is no evidence of hip fracture or dislocation. There is no
evidence of arthropathy or other focal bone abnormality.
IMPRESSION: Negative.

## 2022-06-27 ENCOUNTER — Encounter: Payer: Self-pay | Admitting: Internal Medicine

## 2022-06-27 ENCOUNTER — Ambulatory Visit: Payer: 59 | Admitting: Internal Medicine

## 2022-06-27 VITALS — BP 113/57 | HR 80 | Ht 65.0 in | Wt 120.0 lb

## 2022-06-27 DIAGNOSIS — R079 Chest pain, unspecified: Secondary | ICD-10-CM

## 2022-06-27 DIAGNOSIS — R002 Palpitations: Secondary | ICD-10-CM

## 2022-06-27 DIAGNOSIS — E782 Mixed hyperlipidemia: Secondary | ICD-10-CM

## 2022-06-27 DIAGNOSIS — R9431 Abnormal electrocardiogram [ECG] [EKG]: Secondary | ICD-10-CM

## 2022-06-27 MED ORDER — METOPROLOL TARTRATE 50 MG PO TABS: 50.0000 mg | ORAL_TABLET | Freq: Every day | ORAL | 0 refills | Status: DC

## 2022-06-27 NOTE — Progress Notes (Signed)
Primary Physician/Referring:  Lois Huxley, PA  Patient ID: Patty Garrison, female    DOB: 04/14/1966, 57 y.o.   MRN: [id number]  Chief Complaint  Patient presents with   Chest Pain   New Patient (Initial Visit)   HPI:    Patty Garrison  is a 57 y.o. female with past medical history significant for anxiety with panic attacks, GERD, and asthma who is here to establish care with cardiology due to chest pain.  Patient apparently just took a new job and she has been having worsening chest pain that is more frequent now.  She has had panic attacks for most of her life but these have felt different lately.  She says the pain is really sharp and it radiates out and it lasts less than a minute.  She has had some episodes where chest pain has woken her up from sleep in addition to having palpitations.  Patient also has been having irregular heart rates but she is not sure if this is related to stress.  Patient denies shortness of breath, diaphoresis, syncope, edema, orthopnea, PND, claudication.  Past Medical History:  Diagnosis Date   Anxiety    Depression    PTSD (post-traumatic stress disorder)    Past Surgical History:  Procedure Laterality Date   BUNIONECTOMY     DILATION AND CURETTAGE OF UTERUS     FOOT SURGERY     LAPAROSCOPIC ENDOMETRIOSIS FULGURATION     TMJ ARTHROSCOPY     TONSILLECTOMY     Family History  Problem Relation Age of Onset   Alcohol abuse Brother    Alcoholism Maternal Grandmother    Liver disease Maternal Grandmother    Heart attack Maternal Grandfather    Stroke Paternal Grandmother    Alzheimer's disease Paternal Grandfather    Breast cancer Neg Hx     Social History   Tobacco Use   Smoking status: Never   Smokeless tobacco: Never  Substance Use Topics   Alcohol use: No   Marital Status: Married  ROS  Review of Systems  Cardiovascular:  Positive for chest pain and palpitations.   Objective  Blood pressure (!) 113/57, pulse 80, height 5\' 5"  (1.651  m), weight 120 lb (54.4 kg), SpO2 96 %. Body mass index is 19.97 kg/m.     06/27/2022    8:23 AM 08/22/2021    3:49 PM 07/19/2021    9:32 AM  Vitals with BMI  Height 5\' 5"  5\' 5"  5\' 5"   Weight 120 lbs 118 lbs 119 lbs 6 oz  BMI 19.97 23.55 73.22  Systolic 025 427   Diastolic 57 60   Pulse 80 79      Physical Exam Vitals reviewed.  HENT:     Head: Normocephalic and atraumatic.  Cardiovascular:     Rate and Rhythm: Normal rate and regular rhythm.     Pulses: Normal pulses.     Heart sounds: Normal heart sounds. No murmur heard. Pulmonary:     Effort: Pulmonary effort is normal.     Breath sounds: Normal breath sounds.  Abdominal:     General: Bowel sounds are normal.  Musculoskeletal:     Right lower leg: No edema.     Left lower leg: No edema.  Skin:    General: Skin is warm and dry.  Neurological:     Mental Status: She is alert.     Medications and allergies   Allergies  Allergen Reactions   Cat Hair Extract  Dog Epithelium Allergy Skin Test    Duloxetine     Other Reaction(s): difficulty geting off medication   Molds & Smuts    Msud Aid [Alitraq] Other (See Comments)   Nsaids Other (See Comments)   Other Swelling   Wheat Bran Other (See Comments)    Headache Stomach cramps     Medication list after today's encounter   Current Outpatient Medications:    albuterol (VENTOLIN HFA) 108 (90 Base) MCG/ACT inhaler, Inhale 1 puff into the lungs as needed., Disp: , Rfl:    ALPRAZolam (XANAX) 0.25 MG tablet, Take 1 tablet by mouth as needed., Disp: , Rfl:    Calcium Carb-Cholecalciferol (CALCIUM 1000 + D PO), Take 1,000 mg by mouth daily., Disp: , Rfl:    EPINEPHrine 0.3 mg/0.3 mL IJ SOAJ injection, SMARTSIG:0.3 Milliliter(s) IM Once PRN, Disp: , Rfl:    famotidine (PEPCID) 20 MG tablet, Take 20 mg by mouth 2 (two) times daily., Disp: , Rfl:    metoprolol tartrate (LOPRESSOR) 50 MG tablet, Take 1 tablet (50 mg total) by mouth daily for 7 days., Disp: 7 tablet, Rfl:  0   montelukast (SINGULAIR) 10 MG tablet, Take 1 tablet (10 mg total) by mouth daily at 12 noon., Disp: 90 tablet, Rfl: 1   PARoxetine (PAXIL) 20 MG tablet, Take 40 mg by mouth every morning., Disp: , Rfl:   Laboratory examination:   Lab Results  Component Value Date   NA 140 01/10/2013   K 3.5 01/10/2013   CO2 26 01/10/2013   GLUCOSE 76 01/10/2013   BUN 10 01/10/2013   CREATININE 0.60 01/10/2013   CALCIUM 9.1 01/10/2013   GFRNONAA >90 01/10/2013       Latest Ref Rng & Units 01/10/2013   12:16 PM 07/14/2007   10:10 AM  CMP  Glucose 70 - 99 mg/dL 76  113   BUN 6 - 23 mg/dL 10  10   Creatinine 0.50 - 1.10 mg/dL 0.60  0.54   Sodium 135 - 145 mEq/L 140  134   Potassium 3.5 - 5.1 mEq/L 3.5  3.8   Chloride 96 - 112 mEq/L 105  101   CO2 19 - 32 mEq/L 26  26   Calcium 8.4 - 10.5 mg/dL 9.1  8.7   Total Protein 6.0 - 8.3 g/dL 7.3  6.3   Total Bilirubin 0.3 - 1.2 mg/dL 0.7  0.8   Alkaline Phos 39 - 117 U/L 70  43   AST 0 - 37 U/L 17  19   ALT 0 - 35 U/L 9  13       Latest Ref Rng & Units 01/10/2013   12:16 PM 07/14/2007   10:10 AM  CBC  WBC 4.0 - 10.5 K/uL 5.9  10.5   Hemoglobin 12.0 - 15.0 g/dL 12.2  12.1   Hematocrit 36.0 - 46.0 % 38.5  36.2   Platelets 150 - 400 K/uL 312  268     Lipid Panel No results for input(s): "CHOL", "TRIG", "LDLCALC", "VLDL", "HDL", "CHOLHDL", "LDLDIRECT" in the last 8760 hours.  HEMOGLOBIN A1C No results found for: "HGBA1C", "MPG" TSH No results for input(s): "TSH" in the last 8760 hours.  External labs:  Labs 04/11/2022: TSH 0.92 vitamin D level 27.8 Hemoglobin 13.8 hematocrit 42.2 platelets 291 Glucose 79 BUN 10 creatinine 0.6 potassium 4.2 Total cholesterol 239 triglycerides 130 HDL 75 LDL 141   Radiology:    Cardiac Studies:     EKG:   06/27/2022: Sinus Rhythm with  left atrial enlargement.  Assessment     ICD-10-CM   1. Chest pain of uncertain etiology  V61.6 EKG 12-Lead    PCV ECHOCARDIOGRAM COMPLETE    CT CARDIAC  SCORING ([facility] LOCATIONS ONLY)    2. Palpitations  R00.2 PCV ECHOCARDIOGRAM COMPLETE    CT CARDIAC SCORING ([facility] LOCATIONS ONLY)    3. Nonspecific abnormal electrocardiogram (ECG) (EKG)  R94.31 PCV ECHOCARDIOGRAM COMPLETE    CT CARDIAC SCORING ([facility] LOCATIONS ONLY)    4. Mixed hyperlipidemia  E78.2        Orders Placed This Encounter  Procedures   CT CARDIAC SCORING ([facility] LOCATIONS ONLY)    Standing Status:   Future    Standing Expiration Date:   08/26/2022    Order Specific Question:   Is patient pregnant?    Answer:   No    Order Specific Question:   Preferred imaging location?    Answer:   [facility]   EKG 12-Lead   PCV ECHOCARDIOGRAM COMPLETE    Standing Status:   Future    Standing Expiration Date:   06/28/2023    Meds ordered this encounter  Medications   metoprolol tartrate (LOPRESSOR) 50 MG tablet    Sig: Take 1 tablet (50 mg total) by mouth daily for 7 days.    Dispense:  7 tablet    Refill:  0    Medications Discontinued During This Encounter  Medication Reason   cetirizine (ZYRTEC) 10 MG tablet    loratadine (CLARITIN) 10 MG tablet Completed Course   metroNIDAZOLE (METROCREAM) 0.75 % cream Completed Course   Misc Natural Products (T-RELIEF CBD+13) SUBL Completed Course   Multiple Vitamin (MULTIVITAMIN ADULT) TABS Completed Course   Multiple Vitamin (MULTIVITAMIN WITH MINERALS) TABS tablet Completed Course   omeprazole (PRILOSEC) 40 MG capsule Completed Course   Probiotic Product (PROBIOTIC DAILY PO) Dose change   PARoxetine (PAXIL) 10 MG tablet Dose change   triamcinolone (NASACORT ALLERGY 24HR) 55 MCG/ACT AERO nasal inhaler Completed Course     Recommendations:   Patty Garrison is a 57 y.o.  female with chest pain  Chest pain of uncertain etiology Suspect this is due to anxiety and panic attacks but patient states her symptoms have been different lately Patient denies cardiac history in herself Will obtain CACS to risk stratify Lopressor 50 mg daily for 1 week  leading up to CT - she is on paroxetine and this will increase the concentration of metoprolol in her bloodstream so therefore I will do once daily lopressor leading up to test   Hyperlipidemia LDL relatively elevated per labs in November Will discuss initiation of statin once we get coronary calcium score results Patient has been trying to manage this with diet and exercise   Nonspecific abnormal electrocardiogram (ECG) (EKG) Palpitations Left atrial enlargement noted on EKG but no evidence of acute ischemia Will obtain echocardiogram to further evaluate   Follow-up in 1-2 months or sooner if needed    Floydene Flock, DO, [facility]  06/27/2022, 1:36 PM Office: [phone] Pager: [phone]

## 2022-06-28 ENCOUNTER — Ambulatory Visit: Payer: 59

## 2022-06-28 DIAGNOSIS — R002 Palpitations: Secondary | ICD-10-CM

## 2022-06-28 DIAGNOSIS — R079 Chest pain, unspecified: Secondary | ICD-10-CM

## 2022-06-28 DIAGNOSIS — R9431 Abnormal electrocardiogram [ECG] [EKG]: Secondary | ICD-10-CM

## 2022-06-30 NOTE — Progress Notes (Signed)
normal

## 2022-06-30 NOTE — Progress Notes (Signed)
Gave patient results, she acknowledged understanding and had no further questions.

## 2022-07-18 ENCOUNTER — Other Ambulatory Visit: Payer: Self-pay | Admitting: Family Medicine

## 2022-07-18 DIAGNOSIS — Z1231 Encounter for screening mammogram for malignant neoplasm of breast: Secondary | ICD-10-CM

## 2022-08-09 ENCOUNTER — Ambulatory Visit: Payer: 59 | Admitting: Internal Medicine

## 2022-08-18 ENCOUNTER — Other Ambulatory Visit: Payer: 59

## 2022-08-23 ENCOUNTER — Ambulatory Visit: Payer: 59 | Admitting: Internal Medicine

## 2022-08-23 ENCOUNTER — Encounter: Payer: Self-pay | Admitting: Internal Medicine

## 2022-08-23 VITALS — BP 141/68 | HR 65 | Ht 65.0 in | Wt 127.2 lb

## 2022-08-23 DIAGNOSIS — R002 Palpitations: Secondary | ICD-10-CM

## 2022-08-23 DIAGNOSIS — E782 Mixed hyperlipidemia: Secondary | ICD-10-CM

## 2022-08-23 NOTE — Progress Notes (Signed)
Primary Physician/Referring:  Lois Huxley, PA  Patient ID: Patty Garrison, female    DOB: 04/12/1966, 57 y.o.   MRN: [id number]  Chief Complaint  Patient presents with   Chest Pain   Follow-up   HPI:    Patty Garrison  is a 57 y.o. female with past medical history significant for anxiety with panic attacks, GERD, and asthma who is here for a follow-up visit. She is no longer having any symptoms of chest pain or palpitations. She is now settled into her new job and is not as stressed out. Her prior symptoms seem to have been due to anxiety and a big life change. Patient is feeling well. No concerns or complaints. She would like to follow-up on as needed basis. Patient denies shortness of breath, diaphoresis, syncope, edema, orthopnea, PND, claudication.  Past Medical History:  Diagnosis Date   Anxiety    Depression    PTSD (post-traumatic stress disorder)    Past Surgical History:  Procedure Laterality Date   BUNIONECTOMY     DILATION AND CURETTAGE OF UTERUS     FOOT SURGERY     LAPAROSCOPIC ENDOMETRIOSIS FULGURATION     TMJ ARTHROSCOPY     TONSILLECTOMY     Family History  Problem Relation Age of Onset   Alcohol abuse Brother    Alcoholism Maternal Grandmother    Liver disease Maternal Grandmother    Heart attack Maternal Grandfather    Stroke Paternal Grandmother    Alzheimer's disease Paternal Grandfather    Breast cancer Neg Hx     Social History   Tobacco Use   Smoking status: Never   Smokeless tobacco: Never  Substance Use Topics   Alcohol use: No   Marital Status: Married  ROS  Review of Systems  Cardiovascular:  Negative for chest pain and palpitations.   Objective  Blood pressure (!) 141/68, pulse 65, height 5\' 5"  (1.651 m), weight 127 lb 3.2 oz (57.7 kg), SpO2 98 %. Body mass index is 21.17 kg/m.     08/23/2022    8:38 AM 06/27/2022    8:23 AM 08/22/2021    3:49 PM  Vitals with BMI  Height 5\' 5"  5\' 5"  5\' 5"   Weight 127 lbs 3 oz 120 lbs 118 lbs  BMI  21.17 0000000 Q000111Q  Systolic Q000111Q [id number]  Diastolic 68 57 60  Pulse 65 80 79     Physical Exam Vitals reviewed.  HENT:     Head: Normocephalic and atraumatic.  Cardiovascular:     Rate and Rhythm: Normal rate and regular rhythm.     Pulses: Normal pulses.     Heart sounds: Normal heart sounds. No murmur heard. Pulmonary:     Effort: Pulmonary effort is normal.     Breath sounds: Normal breath sounds.  Abdominal:     General: Bowel sounds are normal.  Musculoskeletal:     Right lower leg: No edema.     Left lower leg: No edema.  Skin:    General: Skin is warm and dry.  Neurological:     Mental Status: She is alert.     Medications and allergies   Allergies  Allergen Reactions   Cat Hair Extract    Dog Epithelium (Canis Lupus Familiaris)    Duloxetine     Other Reaction(s): difficulty geting off medication   Molds & Smuts    Msud Aid [Alitraq] Other (See Comments)   Nsaids Other (See Comments)   Omeprazole  Other Reaction(s): rosacea flare?   Other Swelling   Wheat Other (See Comments)    Headache Stomach cramps     Medication list after today's encounter   Current Outpatient Medications:    albuterol (VENTOLIN HFA) 108 (90 Base) MCG/ACT inhaler, Inhale 1 puff into the lungs as needed., Disp: , Rfl:    ALPRAZolam (XANAX) 0.25 MG tablet, Take 1 tablet by mouth as needed., Disp: , Rfl:    Calcium Carb-Cholecalciferol (CALCIUM 1000 + D PO), Take 1,000 mg by mouth daily., Disp: , Rfl:    famotidine (PEPCID) 20 MG tablet, Take 20 mg by mouth 2 (two) times daily., Disp: , Rfl:    montelukast (SINGULAIR) 10 MG tablet, Take 1 tablet (10 mg total) by mouth daily at 12 noon., Disp: 90 tablet, Rfl: 1   PARoxetine (PAXIL) 20 MG tablet, Take 40 mg by mouth every morning., Disp: , Rfl:    EPINEPHrine 0.3 mg/0.3 mL IJ SOAJ injection, SMARTSIG:0.3 Milliliter(s) IM Once PRN, Disp: , Rfl:    metoprolol tartrate (LOPRESSOR) 50 MG tablet, Take 1 tablet (50 mg total) by mouth  daily for 7 days. (Patient not taking: Reported on 08/23/2022), Disp: 7 tablet, Rfl: 0  Laboratory examination:   Lab Results  Component Value Date   NA 140 01/10/2013   K 3.5 01/10/2013   CO2 26 01/10/2013   GLUCOSE 76 01/10/2013   BUN 10 01/10/2013   CREATININE 0.60 01/10/2013   CALCIUM 9.1 01/10/2013   GFRNONAA >90 01/10/2013       Latest Ref Rng & Units 01/10/2013   12:16 PM 07/14/2007   10:10 AM  CMP  Glucose 70 - 99 mg/dL 76  113   BUN 6 - 23 mg/dL 10  10   Creatinine 0.50 - 1.10 mg/dL 0.60  0.54   Sodium 135 - 145 mEq/L 140  134   Potassium 3.5 - 5.1 mEq/L 3.5  3.8   Chloride 96 - 112 mEq/L 105  101   CO2 19 - 32 mEq/L 26  26   Calcium 8.4 - 10.5 mg/dL 9.1  8.7   Total Protein 6.0 - 8.3 g/dL 7.3  6.3   Total Bilirubin 0.3 - 1.2 mg/dL 0.7  0.8   Alkaline Phos 39 - 117 U/L 70  43   AST 0 - 37 U/L 17  19   ALT 0 - 35 U/L 9  13       Latest Ref Rng & Units 01/10/2013   12:16 PM 07/14/2007   10:10 AM  CBC  WBC 4.0 - 10.5 K/uL 5.9  10.5   Hemoglobin 12.0 - 15.0 g/dL 12.2  12.1   Hematocrit 36.0 - 46.0 % 38.5  36.2   Platelets 150 - 400 K/uL 312  268     Lipid Panel No results for input(s): "CHOL", "TRIG", "LDLCALC", "VLDL", "HDL", "CHOLHDL", "LDLDIRECT" in the last 8760 hours.  HEMOGLOBIN A1C No results found for: "HGBA1C", "MPG" TSH No results for input(s): "TSH" in the last 8760 hours.  External labs:  Labs 04/11/2022: TSH 0.92 vitamin D level 27.8 Hemoglobin 13.8 hematocrit 42.2 platelets 291 Glucose 79 BUN 10 creatinine 0.6 potassium 4.2 Total cholesterol 239 triglycerides 130 HDL 75 LDL 141   Radiology:    Cardiac Studies:   Echocardiogram 06/28/2022: Normal LV systolic function with visual EF 60-65%. Left ventricle cavity is normal in size. Normal global wall motion. Normal diastolic filling pattern, normal LAP. Calculated EF 62%. Structurally normal tricuspid valve.  Mild tricuspid regurgitation. No  evidence of pulmonary hypertension. No  prior available for comparison.    EKG:   06/27/2022: Sinus Rhythm with left atrial enlargement.  Assessment     ICD-10-CM   1. Palpitations  R00.2     2. Mixed hyperlipidemia  E78.2        No orders of the defined types were placed in this encounter.   No orders of the defined types were placed in this encounter.   There are no discontinued medications.    Recommendations:   Shuntavia Roopnarine is a 57 y.o.  female with palpitations  Palpitations This seems to have been due to stress Patient is no longer having chest pain or palpitations  Hyperlipidemia LDL relatively elevated per labs in November Patient has been trying to manage this with diet and exercise    Follow-up on as needed basis    Floydene Flock, DO, [facility]  08/23/2022, 9:16 AM Office: [phone] Pager: [phone]

## 2022-09-01 ENCOUNTER — Ambulatory Visit: Payer: 59

## 2022-10-12 ENCOUNTER — Ambulatory Visit
Admission: RE | Admit: 2022-10-12 | Discharge: 2022-10-12 | Disposition: A | Discharge status: Home / Self Care | Payer: 59 | Source: Ambulatory Visit | Attending: Family Medicine | Admitting: Family Medicine

## 2022-10-12 DIAGNOSIS — Z1231 Encounter for screening mammogram for malignant neoplasm of breast: Secondary | ICD-10-CM

## 2022-10-19 NOTE — Progress Notes (Signed)
[address] [place] [address] Dept: [phone]  FOLLOW UP NOTE  Patient ID: Patty Garrison, female    DOB: [birth year]  Age: 57 y.o. MRN: [id number] Date of Office Visit: 10/20/2022  Assessment  Chief Complaint: Follow-up (Chronic cough since January. Using albuterol more since then.)  HPI Patty Garrison is a 57 year old female who presents to the clinic for follow-up visit.  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    At today's visit, she reports that she had bronchitis in January and after treatment, her cough cleared.  She reports that a few weeks later she developed a different cough which has occurred intermittently between January and now.  She reports this cough occurs in the daytime and nighttime and is worse in the morning.  She reports the cough is sometimes dry and sometimes produces clear mucus.  She reports that she has tried Delsym, a narcotic cough medication, benzonatate, and a prednisone taper.  She reports that each 1 of these things worked for short period of time and then the cough returned.  She denies shortness of breath or wheeze with activity or rest.  She does not report a history of childhood asthma with use of a daily inhaled corticosteroid.  She continues albuterol with mild relief of symptoms.  Allergic rhinitis is reported as moderately well-controlled with symptoms including clear rhinorrhea, sneezing for the last couple weeks, and nasal swelling.  She denies nasal congestion and postnasal drainage.  She denies headache.  She continues Nasacort twice a day and Claritin 10 mg once a day.  She is not currently using a nasal saline rinse.  She reports for application technique with Nasacort. Her last environmental allergy skin testing was on 07/05/2021 and was positive to dust mite.    She continues to avoid dairy products due to throat irritation and increased  cough.  She continues to avoid tree nuts, specifically pecans and walnuts with no accidental ingestion or EpiPen use since her last visit to this clinic.  Her last food allergy testing via lab was positive to walnut on 07/05/2018.  Reflux is reported as well-controlled with no symptoms including heartburn or vomiting.  She continues omeprazole 40 mg once a day.  She reports she takes omeprazole at least 30 minutes before her first meal.  Her current medications are listed in the chart.   Drug Allergies:  Allergies  Allergen Reactions   Cat Hair Extract    Dog Epithelium (Canis Lupus Familiaris)    Duloxetine     Other Reaction(s): difficulty geting off medication   Molds & Smuts    Msud Aid [Alitraq] Other (See Comments)   Nsaids Other (See Comments)   Omeprazole     Other Reaction(s): rosacea flare?   Other Swelling   Wheat Other (See Comments)    Headache Stomach cramps    Physical Exam: BP 122/60   Pulse 66   Temp (!) 97.2 F (36.2 C) (Temporal)   Resp 16   Wt 127 lb 8 oz (57.8 kg)   SpO2 99%   BMI 21.22 kg/m    Physical Exam Vitals reviewed.  Constitutional:      Appearance: Normal appearance.  HENT:     Head: Normocephalic and atraumatic.     Right Ear: Tympanic membrane normal.     Left Ear: Tympanic membrane normal.     Nose:  Comments: Bilateral nares slightly erythematous with thin clear nasal drainage noted.  Pharynx slightly erythematous with no exudate.  Ears normal.  Eyes normal. Eyes:     Conjunctiva/sclera: Conjunctivae normal.  Cardiovascular:     Rate and Rhythm: Normal rate and regular rhythm.     Heart sounds: Normal heart sounds. No murmur heard. Pulmonary:     Effort: Pulmonary effort is normal.     Breath sounds: Normal breath sounds.     Comments: Lungs clear to auscultation Musculoskeletal:        General: Normal range of motion.     Cervical back: Normal range of motion and neck supple.  Skin:    General: Skin is warm and dry.   Neurological:     Mental Status: She is alert and oriented to person, place, and time.  Psychiatric:        Mood and Affect: Mood normal.        Behavior: Behavior normal.        Thought Content: Thought content normal.        Judgment: Judgment normal.     Diagnostics: FVC 3.16 which is 92% of predicted value, FEV1 2.60 which is 96% of predicted value.  Spirometry indicates normal ventilatory function.  Postbronchodilator FVC 3.16 which is 92% of predicted value, FEV1 2.74 to 101% of predicted value with a 5.38% improvement in FEV1.  Assessment and Plan: 1. Chronic cough   2. Adverse food reaction, initial encounter   3. LPRD (laryngopharyngeal reflux disease)   4. Perennial allergic rhinitis   5. Food intolerance     Meds ordered this encounter  Medications   levalbuterol (XOPENEX HFA) inhaler 4 puff    Patient Instructions  Allergic rhinitis Continue allergen avoidance measures directed toward dust mite as listed below Continue montelukast 10 mg once a day Continue Nasacort 2 sprays in each nostril once a day for nasal symptoms.  In the right nostril, point the applicator out toward the right ear. In the left nostril, point the applicator out toward the left ear Began saline nasal rinses as needed for nasal symptoms. Use this before any medicated nasal sprays for best result Stop Claritin for now.  You may use an antihistamine once a day as needed for runny nose or itch Orders have been placed to help [place] evaluate for any new allergen since her last visit to this clinic.  We will call you when the results become available.  Reflux Continue dietary lifestyle modifications as listed below Continue omeprazole 40 mg once a day.  Take this medication 30 to 60 minutes before your first meal  Cough Likely multifactorial causes of cough Begin Asmanex HFA 100-2 puffs twice a day with a spacer to prevent cough or wheeze Continue albuterol 2 puffs once every 4 hours as needed for  cough or wheeze Continue montelukast as listed above Continue nasal rinses and nasal steroid as well as omeprazole  Food allergy Continue to avoid tree nuts, specifically pecan and walnut.  In case of an allergic reaction, take Benadryl 50 mg every 4 hours, and if life-threatening symptoms occur, inject with EpiPen 0.3 mg.  Food intolerance Continue to avoid dairy products.   Call the clinic if this treatment plan is not working well for you  Follow up in 2 months or sooner if needed.   Return in about 2 months (around 12/20/2022), or if symptoms worsen or fail to improve.    Thank you for the opportunity to care for this  patient.  Please do not hesitate to contact me with questions.  Thermon Leyland, FNP [facility]

## 2022-10-19 NOTE — Patient Instructions (Addendum)
Allergic rhinitis Continue allergen avoidance measures directed toward dust mite as listed below Continue montelukast 10 mg once a day Continue Nasacort 2 sprays in each nostril once a day for nasal symptoms.  In the right nostril, point the applicator out toward the right ear. In the left nostril, point the applicator out toward the left ear Began saline nasal rinses as needed for nasal symptoms. Use this before any medicated nasal sprays for best result Stop Claritin for now.  You may use an antihistamine once a day as needed for runny nose or itch Orders have been placed to help [place] evaluate for any new allergen since her last visit to this clinic.  We will call you when the results become available.  Reflux Continue dietary lifestyle modifications as listed below Continue omeprazole 40 mg once a day.  Take this medication 30 to 60 minutes before your first meal  Cough Likely multifactorial causes of cough Begin Asmanex HFA 100-2 puffs twice a day with a spacer to prevent cough or wheeze Continue albuterol 2 puffs once every 4 hours as needed for cough or wheeze Continue montelukast as listed above Continue nasal rinses and nasal steroid as well as omeprazole  Food allergy Continue to avoid tree nuts, specifically pecan and walnut.  In case of an allergic reaction, take Benadryl 50 mg every 4 hours, and if life-threatening symptoms occur, inject with EpiPen 0.3 mg.  Food intolerance Continue to avoid dairy products.   Call the clinic if this treatment plan is not working well for you  Follow up in 2 months or sooner if needed.

## 2022-10-20 ENCOUNTER — Other Ambulatory Visit: Payer: Self-pay

## 2022-10-20 ENCOUNTER — Encounter: Payer: Self-pay | Admitting: Family Medicine

## 2022-10-20 ENCOUNTER — Ambulatory Visit: Payer: 59 | Admitting: Family Medicine

## 2022-10-20 VITALS — BP 122/60 | HR 66 | Temp 97.2°F | Resp 16 | Wt 127.5 lb

## 2022-10-20 DIAGNOSIS — J3089 Other allergic rhinitis: Secondary | ICD-10-CM | POA: Diagnosis not present

## 2022-10-20 DIAGNOSIS — T781XXD Other adverse food reactions, not elsewhere classified, subsequent encounter: Secondary | ICD-10-CM | POA: Diagnosis not present

## 2022-10-20 DIAGNOSIS — R053 Chronic cough: Secondary | ICD-10-CM

## 2022-10-20 DIAGNOSIS — K219 Gastro-esophageal reflux disease without esophagitis: Secondary | ICD-10-CM | POA: Insufficient documentation

## 2022-10-20 DIAGNOSIS — T781XXA Other adverse food reactions, not elsewhere classified, initial encounter: Secondary | ICD-10-CM | POA: Insufficient documentation

## 2022-10-20 DIAGNOSIS — K9049 Malabsorption due to intolerance, not elsewhere classified: Secondary | ICD-10-CM

## 2022-10-20 MED ORDER — LEVALBUTEROL TARTRATE 45 MCG/ACT IN AERO
4.0000 | INHALATION_SPRAY | Freq: Once | RESPIRATORY_TRACT | Status: AC
Start: 2022-10-20 — End: ?

## 2022-10-24 ENCOUNTER — Other Ambulatory Visit (HOSPITAL_COMMUNITY): Payer: Self-pay

## 2022-10-24 ENCOUNTER — Telehealth: Payer: Self-pay

## 2022-10-24 LAB — CBC WITH DIFFERENTIAL/PLATELET
Basophils Absolute: 0.1 10*3/uL (ref 0.0–0.2)
Basos: 1 %
EOS (ABSOLUTE): 0.1 10*3/uL (ref 0.0–0.4)
Eos: 1 %
Hematocrit: 43.2 % (ref 34.0–46.6)
Hemoglobin: 13.8 g/dL (ref 11.1–15.9)
Immature Grans (Abs): 0 10*3/uL (ref 0.0–0.1)
Immature Granulocytes: 0 %
Lymphocytes Absolute: 1.7 10*3/uL (ref 0.7–3.1)
Lymphs: 39 %
MCH: 28.5 pg (ref 26.6–33.0)
MCHC: 31.9 g/dL (ref 31.5–35.7)
MCV: 89 fL (ref 79–97)
Monocytes Absolute: 0.3 10*3/uL (ref 0.1–0.9)
Monocytes: 7 %
Neutrophils Absolute: 2.3 10*3/uL (ref 1.4–7.0)
Neutrophils: 52 %
Platelets: 268 10*3/uL (ref 150–450)
RBC: 4.84 x10E6/uL (ref 3.77–5.28)
RDW: 12 % (ref 11.7–15.4)
WBC: 4.4 10*3/uL (ref 3.4–10.8)

## 2022-10-24 LAB — ALLERGENS, ZONE 2

## 2022-10-24 MED ORDER — ASMANEX HFA 100 MCG/ACT IN AERO: 2.0000 | INHALATION_SPRAY | Freq: Two times a day (BID) | RESPIRATORY_TRACT | 5 refills | Status: DC

## 2022-10-24 NOTE — Progress Notes (Signed)
Can you please let this patient know that her environmental allergy testing was negative. Please have her continue to avoid dust mites as she has had positive skin testing in the past. Please let her know that her CBC was normal and especially eosinophils were normal. Please ask how her breathing is with the use of the inhaler. Thank you

## 2022-10-24 NOTE — Telephone Encounter (Signed)
Per Thermon Leyland can we initiate a PA for Momestasone Furoate(Asmanex HFA) 100 MCG/ACT AERO. Patient needs an inhaler that is not powder due to a milk sensitivity.  Thank you.

## 2022-10-24 NOTE — Telephone Encounter (Signed)
PA submitted, will be updated in additional encounter created.  

## 2022-10-24 NOTE — Telephone Encounter (Signed)
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

## 2022-10-24 NOTE — Progress Notes (Signed)
Can we please start a PA? She needs an inhaler that is not powder due to milk sensitivity. Thank you

## 2022-10-24 NOTE — Addendum Note (Signed)
Addended by: Elsworth Soho on: 10/24/2022 11:05 AM   Modules accepted: Orders

## 2022-10-25 NOTE — Telephone Encounter (Signed)
Patient Advocate Encounter  Prior Authorization for Asmanex HFA 100MCG/ACT aerosol has been approved through [employer].    [employer] Render   Effective: 10-24-2022 to 10-23-2023

## 2022-10-25 NOTE — Telephone Encounter (Signed)
Patient has bee notified on approval and has no further questions or concerns.

## 2022-11-08 ENCOUNTER — Institutional Professional Consult (permissible substitution): Payer: 59 | Admitting: Pulmonary Disease

## 2022-12-14 ENCOUNTER — Ambulatory Visit
Admission: RE | Admit: 2022-12-14 | Discharge: 2022-12-14 | Disposition: A | Discharge status: Home / Self Care | Payer: 59 | Source: Ambulatory Visit | Attending: Family Medicine | Admitting: Family Medicine

## 2022-12-14 DIAGNOSIS — E2839 Other primary ovarian failure: Secondary | ICD-10-CM

## 2022-12-18 ENCOUNTER — Ambulatory Visit: Payer: 59 | Admitting: Family Medicine

## 2022-12-26 ENCOUNTER — Ambulatory Visit (INDEPENDENT_AMBULATORY_CARE_PROVIDER_SITE_OTHER): Payer: 59

## 2022-12-26 ENCOUNTER — Ambulatory Visit: Payer: 59 | Admitting: Pulmonary Disease

## 2022-12-26 ENCOUNTER — Encounter: Payer: Self-pay | Admitting: Pulmonary Disease

## 2022-12-26 VITALS — BP 118/60 | HR 75 | Ht 65.0 in | Wt 125.0 lb

## 2022-12-26 DIAGNOSIS — R053 Chronic cough: Secondary | ICD-10-CM

## 2022-12-26 DIAGNOSIS — J4530 Mild persistent asthma, uncomplicated: Secondary | ICD-10-CM

## 2022-12-26 MED ORDER — WIXELA INHUB 250-50 MCG/ACT IN AEPB: 1.0000 | INHALATION_SPRAY | Freq: Two times a day (BID) | RESPIRATORY_TRACT | 11 refills | Status: DC

## 2022-12-26 NOTE — Progress Notes (Signed)
Synopsis: Referred in July 2024 for chronic cough  Subjective:   PATIENT ID: Patty Garrison GENDER: female DOB: [birth year], MRN: [id number]   HPI  Chief Complaint  Patient presents with   Consult    Referred by PCP for chronic cough for the past 7 months. States the cough is mostly non-productive. Denies any SOB or wheezing.    Patty Garrison is a 57 year old woman, never smoker was referred to pulmonary clinic for chronic cough.  She reports having episode of bronchitis towards the end of last year and has had a lingering cough since.  She reports some improvement of the cough for a brief period of time which then increased in frequency and severity.  She has been trialed on PPI therapy without improvement in her cough.  She has been trialed on Asmanex inhaler over the past 2 to 3 months without much improvement in her cough.  She was placed on a prednisone taper which she noted improvement in her cough but then return of cough symptoms once completed.  She reports waking up from her sleep at least once per night due to the cough.  She denies postnasal drip.  She denies any overt heartburn symptoms.  She denies any skin rashes.  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  She is a never smoker.  She lives with her family.  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  Past Medical History:  Diagnosis Date   Anxiety    Depression    PTSD (post-traumatic stress disorder)      Family History  Problem Relation Age of Onset   Alcohol abuse Brother    Alcoholism Maternal Grandmother    Liver disease Maternal Grandmother    Heart attack Maternal Grandfather    Stroke Paternal Grandmother    Alzheimer's disease Paternal Grandfather    Breast cancer Neg Hx      Social History   Socioeconomic History   Marital status: Married    Spouse name: Not on file   Number of children: Not on file   Years of education: Not on file   Highest  education level: Not on file  Occupational History   Not on file  Tobacco Use   Smoking status: Never   Smokeless tobacco: Never  Vaping Use   Vaping status: Never Used  Substance and Sexual Activity   Alcohol use: No   Drug use: No   Sexual activity: Never  Other Topics Concern   Not on file  Social History Narrative   Not on file   Social Determinants of Health   Financial Resource Strain: Not on file  Food Insecurity: Not on file  Transportation Needs: Not on file  Physical Activity: Not on file  Stress: Not on file  Social Connections: Unknown (08/14/2022)   Received from [facility]   Social Network    Social Network: Not on file  Intimate Partner Violence: Unknown (08/14/2022)   Received from [facility], [facility]   HITS    Physically Hurt: Not on file    Insult or Talk Down To: Not on file    Threaten Physical Harm: Not on file    Scream or Curse: Not on file     Allergies  Allergen Reactions   Cat Hair Extract    Dog Epithelium (Canis Lupus Familiaris)    Duloxetine     Other Reaction(s): difficulty geting off medication  Molds & Smuts    Msud Aid [Alitraq] Other (See Comments)   Nsaids Other (See Comments)   Omeprazole     Other Reaction(s): rosacea flare?   Other Swelling   Wheat Other (See Comments)    Headache Stomach cramps     Outpatient Medications Prior to Visit  Medication Sig Dispense Refill   albuterol (VENTOLIN HFA) 108 (90 Base) MCG/ACT inhaler Inhale 1 puff into the lungs as needed.     Calcium Carb-Cholecalciferol (CALCIUM 1000 + D PO) Take 1,000 mg by mouth daily.     EPINEPHrine 0.3 mg/0.3 mL IJ SOAJ injection SMARTSIG:0.3 Milliliter(s) IM Once PRN     fluticasone (FLONASE) 50 MCG/ACT nasal spray Place 2 sprays into both nostrils daily.     montelukast (SINGULAIR) 10 MG tablet Take 1 tablet (10 mg total) by mouth daily at 12 noon. 90 tablet 1   omeprazole (PRILOSEC) 40 MG capsule Take 40 mg by mouth daily.      PARoxetine (PAXIL) 40 MG tablet Take 40 mg by mouth.     ALPRAZolam (XANAX) 0.25 MG tablet Take 1 tablet by mouth as needed.     loratadine (CLARITIN) 10 MG tablet Take 10 mg by mouth daily.     Mometasone Furoate (ASMANEX HFA) 100 MCG/ACT AERO Inhale 2 puffs into the lungs 2 (two) times daily. 13 g 5   Facility-Administered Medications Prior to Visit  Medication Dose Route Frequency Provider Last Rate Last Admin   levalbuterol (XOPENEX HFA) inhaler 4 puff  4 puff Inhalation Once Ambs, Norvel Richards, FNP        Review of Systems  Constitutional:  Negative for chills, fever, malaise/fatigue and weight loss.  HENT:  Negative for congestion, sinus pain and sore throat.   Eyes: Negative.   Respiratory:  Positive for cough. Negative for hemoptysis, sputum production, shortness of breath and wheezing.   Cardiovascular:  Negative for chest pain, palpitations, orthopnea, claudication and leg swelling.  Gastrointestinal:  Negative for abdominal pain, heartburn, nausea and vomiting.  Genitourinary: Negative.   Musculoskeletal:  Negative for joint pain and myalgias.  Skin:  Negative for rash.  Neurological:  Negative for weakness.  Endo/Heme/Allergies: Negative.   Psychiatric/Behavioral: Negative.        Objective:   Vitals:   12/26/22 1356  BP: 118/60  Pulse: 75  SpO2: 97%  Weight: 125 lb (56.7 kg)  Height: 5\' 5"  (1.651 m)     Physical Exam Constitutional:      General: She is not in acute distress.    Appearance: She is not ill-appearing.  HENT:     Head: Normocephalic and atraumatic.  Eyes:     General: No scleral icterus.    Conjunctiva/sclera: Conjunctivae normal.  Cardiovascular:     Rate and Rhythm: Normal rate and regular rhythm.     Pulses: Normal pulses.     Heart sounds: Normal heart sounds. No murmur heard. Pulmonary:     Effort: Pulmonary effort is normal.     Breath sounds: Normal breath sounds. No wheezing, rhonchi or rales.  Musculoskeletal:     Right lower  leg: No edema.     Left lower leg: No edema.  Skin:    General: Skin is warm and dry.  Neurological:     General: No focal deficit present.     Mental Status: She is alert.       CBC    Component Value Date/Time   WBC 4.4 10/20/2022 1141   WBC 5.9 01/10/2013  1216   RBC 4.84 10/20/2022 1141   RBC 4.62 01/10/2013 1216   HGB 13.8 10/20/2022 1141   HCT 43.2 10/20/2022 1141   PLT 268 10/20/2022 1141   MCV 89 10/20/2022 1141   MCH 28.5 10/20/2022 1141   MCH 26.4 01/10/2013 1216   MCHC 31.9 10/20/2022 1141   MCHC 31.7 01/10/2013 1216   RDW 12.0 10/20/2022 1141   LYMPHSABS 1.7 10/20/2022 1141   MONOABS 0.5 01/10/2013 1216   EOSABS 0.1 10/20/2022 1141   BASOSABS 0.1 10/20/2022 1141      Latest Ref Rng & Units 01/10/2013   12:16 PM 07/14/2007   10:10 AM  BMP  Glucose 70 - 99 mg/dL 76  604   BUN 6 - 23 mg/dL 10  10   Creatinine 5.40 - 1.10 mg/dL 9.81  1.91   Sodium 478 - 145 mEq/L 140  134   Potassium 3.5 - 5.1 mEq/L 3.5  3.8   Chloride 96 - 112 mEq/L 105  101   CO2 19 - 32 mEq/L 26  26   Calcium 8.4 - 10.5 mg/dL 9.1  8.7    Chest imaging: CXR 12/26/22 1. No acute cardiopulmonary process. 2. Nodular densities project over the lower lungs bilaterally, favored to represent nipple shadows. Recommend repeat chest radiograph with nipple markers.   PFT:     No data to display          Labs:  Path:  Echo:  Heart Catheterization:       Assessment & Plan:   Chronic cough  Mild persistent asthma without complication - Plan: fluticasone-salmeterol (WIXELA INHUB) 250-50 MCG/ACT AEPB, DG Chest 2 View  Discussion: Skylie Dobransky is a 57 year old woman, never smoker was referred to pulmonary clinic for chronic cough.  Her cough appears secondary to asthma.  She is to start Wixela 250-50 mcg 1 puff twice daily.  Chest radiograph does not show any acute cardiopulmonary findings.  There is possible increased airway present concerning for a bronchitis like process.   If her cough does not improve with the above inhaler therapy we will move forward with CT chest for further evaluation.  Spirometry from asthma/allergy clinic was within normal limits.  Follow-up in 4 months.  Melody Comas, MD [facility] Office: [phone]   Current Outpatient Medications:    albuterol (VENTOLIN HFA) 108 (90 Base) MCG/ACT inhaler, Inhale 1 puff into the lungs as needed., Disp: , Rfl:    Calcium Carb-Cholecalciferol (CALCIUM 1000 + D PO), Take 1,000 mg by mouth daily., Disp: , Rfl:    EPINEPHrine 0.3 mg/0.3 mL IJ SOAJ injection, SMARTSIG:0.3 Milliliter(s) IM Once PRN, Disp: , Rfl:    fluticasone (FLONASE) 50 MCG/ACT nasal spray, Place 2 sprays into both nostrils daily., Disp: , Rfl:    fluticasone-salmeterol (WIXELA INHUB) 250-50 MCG/ACT AEPB, Inhale 1 puff into the lungs in the morning and at bedtime., Disp: 60 each, Rfl: 11   montelukast (SINGULAIR) 10 MG tablet, Take 1 tablet (10 mg total) by mouth daily at 12 noon., Disp: 90 tablet, Rfl: 1   omeprazole (PRILOSEC) 40 MG capsule, Take 40 mg by mouth daily., Disp: , Rfl:    PARoxetine (PAXIL) 40 MG tablet, Take 40 mg by mouth., Disp: , Rfl:   Current Facility-Administered Medications:    levalbuterol (XOPENEX HFA) inhaler 4 puff, 4 puff, Inhalation, Once, Ambs, Norvel Richards, FNP

## 2022-12-26 NOTE — Patient Instructions (Addendum)
Start wixella inhaler 1 puff twice daily - rinse mouth out after each use  Stop asmanex inhaler  Continue to use albuterol inhaler as needed  We will check chest x-ray today  Follow up in 4 months

## 2023-01-11 ENCOUNTER — Ambulatory Visit (INDEPENDENT_AMBULATORY_CARE_PROVIDER_SITE_OTHER): Payer: 59 | Admitting: Family Medicine

## 2023-01-11 ENCOUNTER — Other Ambulatory Visit: Payer: Self-pay

## 2023-01-11 ENCOUNTER — Encounter: Payer: Self-pay | Admitting: Family Medicine

## 2023-01-11 VITALS — BP 120/54 | HR 75 | Temp 98.0°F | Resp 16 | Wt 130.2 lb

## 2023-01-11 DIAGNOSIS — T781XXA Other adverse food reactions, not elsewhere classified, initial encounter: Secondary | ICD-10-CM

## 2023-01-11 DIAGNOSIS — K9049 Malabsorption due to intolerance, not elsewhere classified: Secondary | ICD-10-CM

## 2023-01-11 DIAGNOSIS — K219 Gastro-esophageal reflux disease without esophagitis: Secondary | ICD-10-CM | POA: Diagnosis not present

## 2023-01-11 DIAGNOSIS — J4540 Moderate persistent asthma, uncomplicated: Secondary | ICD-10-CM | POA: Diagnosis not present

## 2023-01-11 DIAGNOSIS — R053 Chronic cough: Secondary | ICD-10-CM | POA: Diagnosis not present

## 2023-01-11 DIAGNOSIS — T781XXD Other adverse food reactions, not elsewhere classified, subsequent encounter: Secondary | ICD-10-CM

## 2023-01-11 DIAGNOSIS — J3089 Other allergic rhinitis: Secondary | ICD-10-CM | POA: Diagnosis not present

## 2023-01-11 MED ORDER — ALBUTEROL SULFATE HFA 108 (90 BASE) MCG/ACT IN AERS: 1.0000 | INHALATION_SPRAY | RESPIRATORY_TRACT | 1 refills | Status: AC | PRN

## 2023-01-11 MED ORDER — FAMOTIDINE 20 MG PO TABS: 20.0000 mg | ORAL_TABLET | Freq: Two times a day (BID) | ORAL | 5 refills | Status: AC

## 2023-01-11 MED ORDER — BUDESONIDE-FORMOTEROL FUMARATE 80-4.5 MCG/ACT IN AERO
2.0000 | INHALATION_SPRAY | Freq: Two times a day (BID) | RESPIRATORY_TRACT | 5 refills | Status: AC
Start: 2023-01-11 — End: ?

## 2023-01-11 MED ORDER — MONTELUKAST SODIUM 10 MG PO TABS: 10.0000 mg | ORAL_TABLET | Freq: Every day | ORAL | 1 refills | Status: AC

## 2023-01-11 NOTE — Progress Notes (Signed)
[address] [place] [address] Dept: [phone]  FOLLOW UP NOTE  Patient ID: Patty Garrison, female    DOB: 1966/05/07  Age: 57 y.o. MRN: [id number] Date of Office Visit: 01/11/2023  Assessment  Chief Complaint: Follow-up (Pulmonologist prescribed Wixela but patient can only take dose in the morning due to it keeping her awake at night. )  HPI Patty Garrison is a 57 year old female who presents to the clinic for follow-up visit.  She was last seen in this clinic on 10/20/2022 by Thermon Leyland, FNP, for evaluation of asthma, allergic rhinitis, reflux, food allergy, and food intolerance.  In the interim, at this clinic's request, she has visited Dr. Francine Graven, pulmonology specialist, who prescribed Wixela for better control of asthma symptoms.    At today's visit, she reports that her asthma remains not well-controlled with cough producing clear mucus as the main symptom.  She denies shortness of breath or wheeze with activity or rest.  She continues Wixela 250-1 puff once a day and is currently using Asmanex 100-2 puffs in the evening.  She reports that she began using Wixela 250-1 puff twice a day, however, she believes this medication was keeping her awake at night and decreased her dosage to 1 puff in the morning and added Asmanex at night.  She reports a slight increase in her cough with Wixela.  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  Allergic rhinitis is reported as moderately well-controlled with symptoms improved since her last visit.  She reports some nasal congestion, clear rhinorrhea, sneezing for about 2 days, and is not currently experiencing postnasal drainage.  She continues montelukast 10 mg once a day and has recently switched from Nasacort to [facility] which she reports is providing relief of symptoms. Her last environmental allergy skin testing was on 07/05/2021 and was positive to dust mite.    She denies symptoms of reflux including heartburn and vomiting.  She reports  that she had been following a diet to reduce reflux, however, she has included more foods in her diet lately.  She continues omeprazole 30 minutes before her first meal daily.    She continues to avoid tree nuts, dairy, and gluten.  Her last food allergy testing via lab was positive to walnut on 07/05/2018.  Her EpiPen's are up-to-date.  Her current medications are listed in the chart.  Drug Allergies:  Allergies  Allergen Reactions   Cat Hair Extract    Dog Epithelium (Canis Lupus Familiaris)    Duloxetine     Other Reaction(s): difficulty geting off medication   Molds & Smuts    Msud Aid [Alitraq] Other (See Comments)   Nsaids Other (See Comments)   Omeprazole     Other Reaction(s): rosacea flare?   Other Swelling   Wheat Other (See Comments)    Headache Stomach cramps    Physical Exam: BP (!) 120/54   Pulse 75   Temp 98 F (36.7 C) (Temporal)   Resp 16   Wt 130 lb 3.2 oz (59.1 kg)   LMP 02/15/2020   SpO2 98%   BMI 21.67 kg/m    Physical Exam Vitals reviewed.  Constitutional:      Appearance: Normal appearance.  HENT:     Head: Normocephalic and atraumatic.     Right Ear: Tympanic membrane normal.     Left Ear: Tympanic membrane normal.     Nose:     Comments: Bilateral naris slightly erythematous with thin clear nasal drainage noted.  Pharynx  normal.  Ears normal.  Eyes normal.    Mouth/Throat:     Pharynx: Oropharynx is clear.  Eyes:     Conjunctiva/sclera: Conjunctivae normal.  Cardiovascular:     Rate and Rhythm: Normal rate and regular rhythm.     Heart sounds: Normal heart sounds. No murmur heard. Pulmonary:     Effort: Pulmonary effort is normal.     Breath sounds: Normal breath sounds.     Comments: Lungs clear to auscultation Musculoskeletal:        General: Normal range of motion.     Cervical back: Normal range of motion and neck supple.  Skin:    General: Skin is warm and dry.  Neurological:     Mental Status: She is alert and oriented to  person, place, and time.  Psychiatric:        Mood and Affect: Mood normal.        Behavior: Behavior normal.        Thought Content: Thought content normal.        Judgment: Judgment normal.     Diagnostics: FVC 3.28 which is 96% of predicted value, FEV1 2.74 which is 101% of predicted value.  Spirometry indicates normal ventilatory function.  Assessment and Plan: 1. Not well controlled moderate persistent asthma   2. Chronic cough   3. Perennial allergic rhinitis   4. LPRD (laryngopharyngeal reflux disease)   5. Adverse food reaction, initial encounter   6. Food intolerance     Meds ordered this encounter  Medications   budesonide-formoterol (SYMBICORT) 80-4.5 MCG/ACT inhaler    Sig: Inhale 2 puffs into the lungs in the morning and at bedtime.    Dispense:  1 each    Refill:  5   montelukast (SINGULAIR) 10 MG tablet    Sig: Take 1 tablet (10 mg total) by mouth daily at 12 noon.    Dispense:  90 tablet    Refill:  1   famotidine (PEPCID) 20 MG tablet    Sig: Take 1 tablet (20 mg total) by mouth 2 (two) times daily.    Dispense:  60 tablet    Refill:  5   albuterol (VENTOLIN HFA) 108 (90 Base) MCG/ACT inhaler    Sig: Inhale 1 puff into the lungs as needed.    Dispense:  18 g    Refill:  1    Patient Instructions  Allergic rhinitis Continue allergen avoidance measures directed toward dust mite as listed below Continue montelukast 10 mg once a day Continue Nasacort 2 sprays in each nostril once a day for nasal symptoms.  In the right nostril, point the applicator out toward the right ear. In the left nostril, point the applicator out toward the left ear Began saline nasal rinses as needed for nasal symptoms. Use this before any medicated nasal sprays for best result Stop Claritin for now.  You may use an antihistamine once a day as needed for runny nose or itch Orders have been placed to help [place] evaluate for any new allergen since her last visit to this clinic.  We will  call you when the results become available.  Reflux Begin famotidine 20 mg twice a day to control reflux Continue omeprazole 40 mg once a day.  Take this medication 30 to 60 minutes before your first meal Continue dietary lifestyle modifications as listed below  Cough Likely multifactorial causes of cough Begin Symbicort 160-2 puffs twice a day to prevent cough or wheeze. This will replace Wixela  and Asmanex for now Continue albuterol 2 puffs once every 4 hours as needed for cough or wheeze Continue montelukast as listed above Continue nasal rinses and nasal steroid as well as omeprazole  Food allergy Continue to avoid tree nuts, specifically pecan and walnut.  In case of an allergic reaction, take Benadryl 50 mg every 4 hours, and if life-threatening symptoms occur, inject with EpiPen 0.3 mg.  Food intolerance Continue to avoid dairy products.   Call the clinic if this treatment plan is not working well for you  Follow up in 2 months or sooner if needed.  Return in about 2 months (around 03/13/2023), or if symptoms worsen or fail to improve.    Thank you for the opportunity to care for this patient.  Please do not hesitate to contact me with questions.  Thermon Leyland, FNP [facility]

## 2023-01-11 NOTE — Patient Instructions (Addendum)
Allergic rhinitis Continue allergen avoidance measures directed toward dust mite as listed below Continue montelukast 10 mg once a day Continue Nasacort 2 sprays in each nostril once a day for nasal symptoms.  In the right nostril, point the applicator out toward the right ear. In the left nostril, point the applicator out toward the left ear Began saline nasal rinses as needed for nasal symptoms. Use this before any medicated nasal sprays for best result Stop Claritin for now.  You may use an antihistamine once a day as needed for runny nose or itch Orders have been placed to help [place] evaluate for any new allergen since her last visit to this clinic.  We will call you when the results become available.  Reflux Begin famotidine 20 mg twice a day to control reflux Continue omeprazole 40 mg once a day.  Take this medication 30 to 60 minutes before your first meal Continue dietary lifestyle modifications as listed below  Cough Likely multifactorial causes of cough Begin Symbicort 160-2 puffs twice a day to prevent cough or wheeze. This will replace Wixela and Asmanex for now Continue albuterol 2 puffs once every 4 hours as needed for cough or wheeze Continue montelukast as listed above Continue nasal rinses and nasal steroid as well as omeprazole  Food allergy Continue to avoid tree nuts, specifically pecan and walnut.  In case of an allergic reaction, take Benadryl 50 mg every 4 hours, and if life-threatening symptoms occur, inject with EpiPen 0.3 mg.  Food intolerance Continue to avoid dairy products.   Call the clinic if this treatment plan is not working well for you  Follow up in 2 months or sooner if needed.

## 2023-01-12 ENCOUNTER — Encounter: Payer: Self-pay | Admitting: Family Medicine

## 2023-01-12 DIAGNOSIS — J4540 Moderate persistent asthma, uncomplicated: Secondary | ICD-10-CM | POA: Insufficient documentation

## 2023-05-08 ENCOUNTER — Encounter (INDEPENDENT_AMBULATORY_CARE_PROVIDER_SITE_OTHER): Payer: Self-pay | Admitting: Otolaryngology

## 2023-06-18 ENCOUNTER — Telehealth (INDEPENDENT_AMBULATORY_CARE_PROVIDER_SITE_OTHER): Payer: Self-pay | Admitting: Otolaryngology

## 2023-06-18 NOTE — Telephone Encounter (Signed)
LVM to confirm appt & location [phone] afm

## 2023-06-19 ENCOUNTER — Institutional Professional Consult (permissible substitution) (INDEPENDENT_AMBULATORY_CARE_PROVIDER_SITE_OTHER): Payer: 59

## 2023-07-31 ENCOUNTER — Telehealth (INDEPENDENT_AMBULATORY_CARE_PROVIDER_SITE_OTHER): Payer: Self-pay | Admitting: Otolaryngology

## 2023-07-31 NOTE — Telephone Encounter (Signed)
 Confirmed appt and location with patient for 08/01/2023.

## 2023-08-01 ENCOUNTER — Encounter (INDEPENDENT_AMBULATORY_CARE_PROVIDER_SITE_OTHER): Payer: Self-pay

## 2023-08-01 ENCOUNTER — Ambulatory Visit (INDEPENDENT_AMBULATORY_CARE_PROVIDER_SITE_OTHER): Payer: 59 | Admitting: Otolaryngology

## 2023-08-01 VITALS — BP 146/71 | HR 100 | Ht 66.0 in | Wt 130.0 lb

## 2023-08-01 DIAGNOSIS — H6121 Impacted cerumen, right ear: Secondary | ICD-10-CM | POA: Diagnosis not present

## 2023-08-01 DIAGNOSIS — H9313 Tinnitus, bilateral: Secondary | ICD-10-CM

## 2023-08-02 ENCOUNTER — Ambulatory Visit (INDEPENDENT_AMBULATORY_CARE_PROVIDER_SITE_OTHER): Admitting: Audiology

## 2023-08-02 ENCOUNTER — Encounter (INDEPENDENT_AMBULATORY_CARE_PROVIDER_SITE_OTHER): Payer: Self-pay

## 2023-08-04 DIAGNOSIS — H9313 Tinnitus, bilateral: Secondary | ICD-10-CM | POA: Insufficient documentation

## 2023-08-04 DIAGNOSIS — H6121 Impacted cerumen, right ear: Secondary | ICD-10-CM | POA: Insufficient documentation

## 2023-08-04 NOTE — Progress Notes (Signed)
 Patient ID: Patty Garrison, female   DOB: 1965-10-01, 58 y.o.   MRN: [id number]  CC: Bilateral tinnitus  HPI:  Patty Garrison is a 58 y.o. female who presents today complaining of bilateral tinnitus for the past year.  The patient describes her tinnitus as a constant high-pitched ringing noise.  It is nonpulsatile.  The patient has no recent otitis media or otitis externa.  She has no previous otologic surgery.  She also denies any significant hearing loss.  Currently she denies any otalgia, otorrhea, or vertigo.  Past Medical History:  Diagnosis Date   Anxiety    Depression    PTSD (post-traumatic stress disorder)     Past Surgical History:  Procedure Laterality Date   BUNIONECTOMY     DILATION AND CURETTAGE OF UTERUS     FOOT SURGERY     LAPAROSCOPIC ENDOMETRIOSIS FULGURATION     TMJ ARTHROSCOPY     TONSILLECTOMY      Family History  Problem Relation Age of Onset   Alcohol abuse Brother    Alcoholism Maternal Grandmother    Liver disease Maternal Grandmother    Heart attack Maternal Grandfather    Stroke Paternal Grandmother    Alzheimer's disease Paternal Grandfather    Breast cancer Neg Hx     Social History:  reports that she has never smoked. She has never used smokeless tobacco. She reports that she does not drink alcohol and does not use drugs.  Allergies:  Allergies  Allergen Reactions   Cat Dander    Dog Epithelium (Canis Lupus Familiaris)    Duloxetine     Other Reaction(s): difficulty geting off medication   Molds & Smuts    Msud Aid [Alitraq] Other (See Comments)   Nsaids Other (See Comments)   Omeprazole     Other Reaction(s): rosacea flare?   Other Swelling   Wheat Other (See Comments)    Headache Stomach cramps    Prior to Admission medications   Medication Sig Start Date End Date Taking? Authorizing Provider  albuterol (VENTOLIN HFA) 108 (90 Base) MCG/ACT inhaler Inhale 1 puff into the lungs as needed. 01/11/23  Yes Ambs, Norvel Richards, FNP   budesonide-formoterol (SYMBICORT) 80-4.5 MCG/ACT inhaler Inhale 2 puffs into the lungs in the morning and at bedtime. 01/11/23  Yes Ambs, Norvel Richards, FNP  Calcium Carb-Cholecalciferol (CALCIUM 1000 + D PO) Take 1,000 mg by mouth daily.   Yes [provider]  cetirizine (ZYRTEC) 10 MG tablet Take 10 mg by mouth at bedtime.   Yes [provider]  EPINEPHrine 0.3 mg/0.3 mL IJ SOAJ injection SMARTSIG:0.3 Milliliter(s) IM Once PRN 07/13/21  Yes [provider]  famotidine (PEPCID) 20 MG tablet Take 1 tablet (20 mg total) by mouth 2 (two) times daily. 01/11/23  Yes Ambs, Norvel Richards, FNP  fluticasone (FLONASE) 50 MCG/ACT nasal spray Place 2 sprays into both nostrils daily.   Yes [provider]  omeprazole (PRILOSEC) 40 MG capsule Take 40 mg by mouth daily. 10/11/22  Yes [provider]  PARoxetine (PAXIL) 40 MG tablet Take 40 mg by mouth.   Yes [provider]  montelukast (SINGULAIR) 10 MG tablet Take 1 tablet (10 mg total) by mouth daily at 12 noon. Patient not taking: Reported on 08/01/2023 01/11/23   Hetty Blend, FNP    Blood pressure (!) 146/71, pulse 100, height 5\' 6"  (1.676 m), weight 130 lb (59 kg), last menstrual period 02/15/2020, SpO2 98%. Exam: General: Communicates without difficulty, well nourished, no acute  distress. Head: Normocephalic, no evidence injury, no tenderness, facial buttresses intact without stepoff. Face/sinus: No tenderness to palpation and percussion. Facial movement is normal and symmetric. Eyes: PERRL, EOMI. No scleral icterus, conjunctivae clear. Neuro: CN II exam reveals vision grossly intact.  No nystagmus at any point of gaze. Ears: Auricles well formed without lesions. EAC: Right ear cerumen impaction.  Under the operating microscope, the cerumen is carefully removed with a combination of cerumen currette, alligator forceps, and suction catheters.  After the cerumen is removed, the TMs are noted to be normal. Nose: External  evaluation reveals normal support and skin without lesions.  Dorsum is intact.  Anterior rhinoscopy reveals normal mucosa over anterior aspect of inferior turbinates and intact septum.  No purulence noted. Oral:  Oral cavity and oropharynx are intact, symmetric, without erythema or edema.  Mucosa is moist without lesions. Neck: Full range of motion without pain.  There is no significant lymphadenopathy.  No masses palpable.  Thyroid bed within normal limits to palpation.  Parotid glands and submandibular glands equal bilaterally without mass.  Trachea is midline. Neuro:  CN 2-12 grossly intact.   Procedure: Right ear cerumen disimpaction Anesthesia: None Description: Under the operating microscope, the cerumen is carefully removed with a combination of cerumen currette, alligator forceps, and suction catheters.  After the cerumen is removed, the TMs are noted to be normal.  No mass, erythema, or lesions. The patient tolerated the procedure well.    Assessment: 1.  Right ear cerumen impaction.  After the disimpaction procedure, both tympanic membranes and middle ear spaces are noted to be normal. 2.  Bilateral subjective tinnitus.  Plan: 1.  Otomicroscopy with right ear cerumen disimpaction. 2.  The strategies to cope with tinnitus, including the use of masker, hearing aids, tinnitus retraining therapy, and avoidance of caffeine and alcohol are discussed.  3.  Outpatient hearing test.  No audiologist is available today. 4.  The patient is encouraged to call with any questions or concerns.  Patty Garrison W Patty Garrison 08/04/2023, 12:40 PM

## 2023-11-17 ENCOUNTER — Other Ambulatory Visit: Payer: Self-pay

## 2023-11-17 ENCOUNTER — Encounter (HOSPITAL_BASED_OUTPATIENT_CLINIC_OR_DEPARTMENT_OTHER): Payer: Self-pay

## 2023-11-17 ENCOUNTER — Emergency Department (HOSPITAL_BASED_OUTPATIENT_CLINIC_OR_DEPARTMENT_OTHER): Admission: EM | Admit: 2023-11-17 | Discharge: 2023-11-17 | Disposition: A | Discharge status: Home / Self Care

## 2023-11-17 DIAGNOSIS — H5789 Other specified disorders of eye and adnexa: Secondary | ICD-10-CM | POA: Insufficient documentation

## 2023-11-17 MED ORDER — ERYTHROMYCIN 5 MG/GM OP OINT: TOPICAL_OINTMENT | OPHTHALMIC | 0 refills | Status: AC

## 2023-11-17 NOTE — ED Triage Notes (Addendum)
 POV/ pt has been using expired nasal spray as eyedrops, on accident / pt c/o mild eye itching/ pt was told to come here by a friend/ active ingredient in nasal spray is NS/ pt a&[id number]

## 2023-11-17 NOTE — ED Provider Notes (Signed)
 [place] [facility] Provider Note   CSN: [id number] Arrival date & time: 11/17/23  2118     Patient presents with: Eye Problem   Patty Garrison is a 58 y.o. female.   58 year old female presenting to the emergency department today with mild bilateral eye irritation.  The patient states this been going now for the past few days and has gotten gradually worse since she started using what she thought were some eyedrops.  When she looked she noticed that this was saline nasal spray.  She states that this has been used prior to this and is expired.  She does not wear contacts.   Eye Problem Associated symptoms: itching and redness        Prior to Admission medications   Medication Sig Start Date End Date Taking? Authorizing Provider  erythromycin ophthalmic ointment Place a 1/2 inch ribbon of ointment into the lower eyelid. 11/17/23  Yes Ula Prentice SAUNDERS, MD  albuterol  (VENTOLIN  HFA) 108 [phone] Base) MCG/ACT inhaler Inhale 1 puff into the lungs as needed. 01/11/23   Cari Arlean HERO, FNP  budesonide -formoterol  (SYMBICORT ) 80-4.5 MCG/ACT inhaler Inhale 2 puffs into the lungs in the morning and at bedtime. 01/11/23   Cari Arlean HERO, FNP  Calcium Carb-Cholecalciferol (CALCIUM 1000 + D PO) Take 1,000 mg by mouth daily.    [provider]  cetirizine (ZYRTEC) 10 MG tablet Take 10 mg by mouth at bedtime.    [provider]  EPINEPHrine  0.3 mg/0.3 mL IJ SOAJ injection SMARTSIG:0.3 Milliliter(s) IM Once PRN 07/13/21   [provider]  famotidine  (PEPCID ) 20 MG tablet Take 1 tablet (20 mg total) by mouth 2 (two) times daily. 01/11/23   Cari Arlean HERO, FNP  fluticasone (FLONASE) 50 MCG/ACT nasal spray Place 2 sprays into both nostrils daily.    [provider]  montelukast  (SINGULAIR ) 10 MG tablet Take 1 tablet (10 mg total) by mouth daily at 12 noon. Patient not taking: Reported on 08/01/2023 01/11/23   Cari Arlean HERO, FNP  omeprazole  (PRILOSEC) 40  MG capsule Take 40 mg by mouth daily. 10/11/22   [provider]  PARoxetine (PAXIL) 40 MG tablet Take 40 mg by mouth.    [provider]    Allergies: Cat dander, Dog epithelium (canis lupus familiaris), Duloxetine , Molds & smuts, Msud aid [alitraq], Nsaids, Omeprazole , Other, and Wheat    Review of Systems  Eyes:  Positive for redness and itching.  All other systems reviewed and are negative.   Updated Vital Signs BP (!) 119/57 (BP Location: Left Arm)   Pulse 72   Temp 98 F (36.7 C) (Oral)   Resp 16   Wt 57.2 kg   LMP 02/15/2020   SpO2 95%   BMI 20.34 kg/m   Physical Exam Vitals and nursing note reviewed.  Constitutional:      Appearance: Normal appearance.   Eyes:     Comments: There is mild bilateral conjunctival injection, no chemosis noted, pupils equal, round, and reactive to light.  No proptosis.  Extraocular movements intact.   Neurological:     Mental Status: She is alert.     (all labs ordered are listed, but only abnormal results are displayed) Labs Reviewed - No data to display  EKG: None  Radiology: No results found.   Procedures   Medications Ordered in the ED - No data to display  Medical Decision Making 58 year old female presents the emergency department today with mild eye irritation after realizing that she has been using out of date nasal spray that has been used before in the past.  The patient is well-appearing here with stable vital signs.  Her eye exam is largely unremarkable.  Will start the patient on antibiotic eye ointment in the event that she has developed some conjunctivitis from the expired nasal spray that has been used in her nose before.        Final diagnoses:  Eye irritation    ED Discharge Orders          Ordered    erythromycin ophthalmic ointment        11/17/23 2226               Ula Prentice SAUNDERS, MD 11/17/23 2226

## 2023-11-17 NOTE — Discharge Instructions (Signed)
 Please start using the erythromycin ointment.  You may also pick up some artificial tears at the drugstore and try these to see if this helps with your symptoms.  Follow-up with your doctor.  Return to the ER for worsening symptoms.

## 2024-05-28 ENCOUNTER — Ambulatory Visit: Admitting: Neurology

## 2024-05-28 ENCOUNTER — Encounter: Payer: Self-pay | Admitting: Neurology

## 2024-05-28 VITALS — BP 116/72 | HR 94 | Ht 66.0 in | Wt 131.8 lb

## 2024-05-28 DIAGNOSIS — D6489 Other specified anemias: Secondary | ICD-10-CM | POA: Insufficient documentation

## 2024-05-28 DIAGNOSIS — G252 Other specified forms of tremor: Secondary | ICD-10-CM | POA: Diagnosis not present

## 2024-05-28 NOTE — Patient Instructions (Addendum)
 You have a mild tremor of the head, no hand tremor.   I do not see any signs or symptoms of parkinson's like disease or what we call parkinsonism.  For your tremor, I would not recommend any new medications at this time.  Down the road a beta-blocker can be considered if need be but like I said sometimes the side effects outweigh the benefit from medications.  Also, head tremors are notoriously harder to treat than hand tremors. With your next checkup with your PCP, you have your physical coming up in January, I recommend you get your thyroid  function checked as thyroid  dysfunction, particularly over function and sometimes cause tremors or exacerbate them.Please remember, that any kind of tremor may be exacerbated by anxiety, anger, nervousness, excitement, dehydration, sleep deprivation, thyroid  dysfunction, by caffeine, and low blood sugar values or blood sugar fluctuations. Some medications can exacerbate tremors, this includes certain asthma or COPD medications and certain antidepressants.  At this juncture, you can follow-up with your primary care and other providers as scheduled/planned.

## 2024-05-28 NOTE — Progress Notes (Signed)
 Subjective:    Patient ID: Patty Garrison is a 58 y.o. female.  HPI    True Mar, MD, PhD [facility] [address], [address] [address] [place], [place] [address]  Dear Dr. Ricky,  I saw your patient, Patty Garrison, upon your kind request in my neurologic clinic today for evaluation of her head tremor.  The patient is unaccompanied today.  As you know, Patty Garrison is a 58 year old female with an underlying medical history of restless leg syndrome, iron deficiency, insomnia, asthma, thyroiditis, alopecia, rosacea, TMJ, reflux disease, anxiety, and depression, who reports an approximately 2 year history of gradual onset of a head tremor. First it was slight and only noticed by her.  But in the past 6 months or so other people have noticed the tremor and have commented on it.  She has no family history of tremor.  She has no hand tremors.  Tremor in general does not bother her as such and does not prevent her from doing her day-to-day activities.  She has noticed increase of tremor with any amount of stress even minor stress.  She is no longer on Paxil.  She had a sleep study in the past and was negative for sleep apnea but did not get enough REM sleep as she recalls.  She was on some antidepressant at the time.  She has not fallen, she has not noticed any other neurological symptoms and denies any headaches or fine motor dysfunction or balance issues.  She tries to hydrate well, drinks about 64 ounces of water per day.  No alcohol, no caffeine on a day-to-day basis, she is a non-smoker.  I reviewed your office note from 03/31/2024.  She reported a 2-year history of a head tremor at the time.  Of note, she is followed by [facility] neurology for her sleep disorder.  She had blood work through your office on 03/31/2024 as well as 02/14/2024.  I reviewed her blood test results from 02/14/2024.  Ferritin was 22.3, iron studies otherwise benign, vitamin B12 642, folate above 23.7.  She is currently  on alprazolam  as needed for anxiety.  She has tried Seroquel in the past for insomnia as well as mirtazapine .  She had previously been on paroxetine but is no longer on it.  I reviewed the [facility] neurology note from 03/19/2024.  She was advised to start iron infusions.  She was advised to start oral iron supplementation.  She was advised to avoid antihistamines including hydroxyzine.  Her Past Medical History Is Significant For: Past Medical History:  Diagnosis Date   Anxiety    Depression    PTSD (post-traumatic stress disorder)     Her Past Surgical History Is Significant For: Past Surgical History:  Procedure Laterality Date   BUNIONECTOMY     DILATION AND CURETTAGE OF UTERUS     FOOT SURGERY     LAPAROSCOPIC ENDOMETRIOSIS FULGURATION     TMJ ARTHROSCOPY     TONSILLECTOMY      Her Family History Is Significant For: Family History  Problem Relation Age of Onset   Sleep apnea Father    Alcohol abuse Brother    Alcoholism Maternal Grandmother    Liver disease Maternal Grandmother    Heart attack Maternal Grandfather    Stroke Paternal Grandmother    Alzheimer's disease Paternal Grandfather    Breast cancer Neg Hx    Migraines Neg Hx    Seizures Neg Hx     Her Social History Is  Significant For: Social History   Socioeconomic History   Marital status: Married    Spouse name: Not on file   Number of children: Not on file   Years of education: Not on file   Highest education level: Not on file  Occupational History   Not on file  Tobacco Use   Smoking status: Never   Smokeless tobacco: Never  Vaping Use   Vaping status: Never Used  Substance and Sexual Activity   Alcohol use: No   Drug use: No   Sexual activity: Never  Other Topics Concern   Not on file  Social History Narrative   No caffeine - caffeine free 30 years now    Social Drivers of Health   Tobacco Use: Low Risk (03/19/2024)   Received from [facility]   Patient History    Smoking Tobacco Use:  Never    Smokeless Tobacco Use: Never    Passive Exposure: Never  Financial Resource Strain: Not on file  Food Insecurity: Not on file  Transportation Needs: Not on file  Physical Activity: Not on file  Stress: Not on file  Social Connections: Unknown (08/14/2022)   Received from [facility]   Social Network    Social Network: Not on file  Depression (PHQ2-9): Not on file  Alcohol Screen: Not on file  Housing: Not on file  Utilities: Not on file  Health Literacy: Not on file    Her Allergies Are:  Allergies[1]:   Her Current Medications Are:  Outpatient Encounter Medications as of 05/28/2024  Medication Sig   albuterol  (VENTOLIN  HFA) 108 (90 Base) MCG/ACT inhaler Inhale 1 puff into the lungs as needed.   budesonide -formoterol  (SYMBICORT ) 80-4.5 MCG/ACT inhaler Inhale 2 puffs into the lungs in the morning and at bedtime.   Calcium Carb-Cholecalciferol (CALCIUM 1000 + D PO) Take 1,000 mg by mouth daily.   Cholecalciferol 125 MCG (5000 UT) TABS Take 1 tablet by mouth daily.   EPINEPHrine  0.3 mg/0.3 mL IJ SOAJ injection SMARTSIG:0.3 Milliliter(s) IM Once PRN   fluticasone (FLONASE) 50 MCG/ACT nasal spray Place 2 sprays into both nostrils daily.   MAGNESIUM PO Take 120 mg by mouth.   montelukast  (SINGULAIR ) 10 MG tablet Take 1 tablet (10 mg total) by mouth daily at 12 noon.   Multiple Vitamin (MULTI-VITAMIN) tablet Take 1 tablet by mouth daily.   omeprazole  (PRILOSEC) 40 MG capsule Take 40 mg by mouth daily.   cetirizine (ZYRTEC) 10 MG tablet Take 10 mg by mouth at bedtime. (Patient not taking: Reported on 05/28/2024)   erythromycin  ophthalmic ointment Place a 1/2 inch ribbon of ointment into the lower eyelid. (Patient not taking: Reported on 05/28/2024)   famotidine  (PEPCID ) 20 MG tablet Take 1 tablet (20 mg total) by mouth 2 (two) times daily. (Patient not taking: Reported on 05/28/2024)   PARoxetine (PAXIL) 40 MG tablet Take 40 mg by mouth. (Patient not taking: Reported on  05/28/2024)   Facility-Administered Encounter Medications as of 05/28/2024  Medication   levalbuterol  (XOPENEX  HFA) inhaler 4 puff  :   Review of Systems:  Out of a complete 14 point review of systems, all are reviewed and negative with the exception of these symptoms as listed below:  Review of Systems  Objective:  Neurological Exam  Physical Exam Physical Examination:   Vitals:   05/28/24 0815  BP: 116/72  Pulse: 94  SpO2: 98%    General Examination: The patient is a very pleasant 58 y.o. female in no acute distress.  She appears well-developed and well-nourished and well groomed.   HEENT: Normocephalic, atraumatic, pupils are equal, round and reactive to light, extraocular tracking is good without limitation to gaze excursion or nystagmus noted. No photophobia.  Corrective eye glasses in place.  Funduscopic exam benign.  Hearing is grossly intact.  Face is symmetric with normal facial animation and normal facial sensation to light touch, temperature and vibration sense. Speech is clear without dysarthria. There is no hypophonia. There is no lip, jaw or voice tremor.  She has a mild side-to-side head tremor, intermittent.  Neck is supple with full range of passive and active motion. There are no carotid bruits on auscultation.  Airway/Oropharynx exam reveals: No significant mouth dryness, good dental hygiene, no significant airway crowding, tonsils absent.  Tongue protrudes centrally and palate elevates symmetrically.   Chest: Clear to auscultation without wheezing, rhonchi or crackles noted.  Heart: S1+S2+0, regular and normal without murmurs, rubs or gallops noted.   Abdomen: Soft, non-tender and non-distended.  Extremities: There is no pitting edema in the distal lower extremities bilaterally.   Skin: Warm and dry without trophic changes noted.   Musculoskeletal: exam reveals no obvious joint deformities.   Neurologically:  Mental status: The patient is awake, alert  and oriented in all 4 spheres. Her immediate and remote memory, attention, language skills and fund of knowledge are appropriate. There is no evidence of aphasia, agnosia, apraxia or anomia. Speech is clear with normal prosody and enunciation. Thought process is linear. Mood is normal and affect is normal.  Cranial nerves II - XII are as described above under HEENT exam.  Motor exam: Normal bulk, strength and tone is noted. There is no obvious action or resting tremor.  No drift or rebound, no postural or action tremor. Fine motor skills and coordination: Intact finger taps, hand movements and rapid alternating patting with both upper extremities, normal foot taps bilaterally in the lower extremities.  Cerebellar testing: No dysmetria or intention tremor. There is no truncal or gait ataxia.  Normal finger-to-nose, normal heel-to-shin bilaterally. Sensory exam: intact to light touch, temperature and vibration sense in the upper and lower extremities.  Reflexes 2+ throughout, toes are downgoing bilaterally. Romberg negative. Gait, station and balance: She stands easily. No veering to one side is noted. No leaning to one side is noted. Posture is age-appropriate and stance is narrow based. Gait shows normal stride length and normal pace. No problems turning are noted.  Normal tandem walk.  Assessment and Plan:   In summary, Starkisha Tullis is a very pleasant 58 year old female with an underlying medical history of restless leg syndrome, iron deficiency, insomnia, asthma, thyroiditis, alopecia, rosacea, TMJ, reflux disease, anxiety, and depression, who presents for evaluation of her head tremor of approximately 2 years duration with mild progression noted.  History and examination are in keeping with benign isolated head tremor, possible essential tremor without any obvious family history, no obvious hand tremor, no evidence of parkinsonism and she is reassured in that regard.  I had a long discussion with the  patient today.  She was advised to continue to monitor her symptoms.  I talked to her about potential use of a beta-blocker down the road but talked her about potential side effects and concerns, blood pressure currently on the low normal side, she is not keen on starting any new medication quite yet.  Below is a summary of my recommendations and our discussion points from today's visit, based on chart review, history and examination. They were  given these instructions verbally during the visit in detail and also in writing in the MyChart after visit summary (AVS), which they can access electronically. << You have a mild tremor of the head, no hand tremor.   I do not see any signs or symptoms of parkinson's like disease or what we call parkinsonism.  For your tremor, I would not recommend any new medications at this time.  Down the road a beta-blocker can be considered if need be but like I said sometimes the side effects outweigh the benefit from medications.  Also, head tremors are notoriously harder to treat than hand tremors. With your next checkup with your PCP, you have your physical coming up in January, I recommend you get your thyroid  function checked as thyroid  dysfunction, particularly over function and sometimes cause tremors or exacerbate them.Please remember, that any kind of tremor may be exacerbated by anxiety, anger, nervousness, excitement, dehydration, sleep deprivation, thyroid  dysfunction, by caffeine, and low blood sugar values or blood sugar fluctuations. Some medications can exacerbate tremors, this includes certain asthma or COPD medications and certain antidepressants.  At this juncture, you can follow-up with your primary care and other providers as scheduled/planned.>>    Thank you very much for allowing me to participate in the care of this nice patient. If I can be of any further assistance to you please do not hesitate to call me at [phone].  Sincerely,   True Mar, MD, PhD     [1]  Allergies Allergen Reactions   Cat Dander    Dog Epithelium (Canis Lupus Familiaris)    Duloxetine      Other Reaction(s): difficulty geting off medication   Molds & Smuts    Msud Aid [Alitraq] Other (See Comments)   Nsaids Other (See Comments)   Omeprazole      Other Reaction(s): rosacea flare?   Other Swelling   Wheat Other (See Comments)    Headache Stomach cramps

## 2024-06-26 ENCOUNTER — Other Ambulatory Visit: Payer: Self-pay | Admitting: Family Medicine

## 2024-06-26 ENCOUNTER — Other Ambulatory Visit (HOSPITAL_BASED_OUTPATIENT_CLINIC_OR_DEPARTMENT_OTHER): Payer: Self-pay | Admitting: Family Medicine

## 2024-06-26 DIAGNOSIS — Z1231 Encounter for screening mammogram for malignant neoplasm of breast: Secondary | ICD-10-CM

## 2024-06-26 DIAGNOSIS — E2839 Other primary ovarian failure: Secondary | ICD-10-CM

## 2024-07-17 ENCOUNTER — Ambulatory Visit

## 2024-07-31 ENCOUNTER — Other Ambulatory Visit (HOSPITAL_BASED_OUTPATIENT_CLINIC_OR_DEPARTMENT_OTHER)

## 2024-12-01 ENCOUNTER — Other Ambulatory Visit (HOSPITAL_BASED_OUTPATIENT_CLINIC_OR_DEPARTMENT_OTHER)
